# Patient Record
Sex: Female | Born: 1980 | Race: White | Hispanic: No | Marital: Married | Smoking: Never smoker
Health system: Southern US, Community
[De-identification: ages and names within clinical notes are randomized; demographics above are authoritative.]

## PROBLEM LIST (undated history)

## (undated) DIAGNOSIS — D649 Anemia, unspecified: Secondary | ICD-10-CM

## (undated) DIAGNOSIS — IMO0002 Reserved for concepts with insufficient information to code with codable children: Secondary | ICD-10-CM

## (undated) DIAGNOSIS — R9431 Abnormal electrocardiogram [ECG] [EKG]: Secondary | ICD-10-CM

## (undated) DIAGNOSIS — Z87898 Personal history of other specified conditions: Secondary | ICD-10-CM

## (undated) HISTORY — DX: Personal history of other specified conditions: Z87.898

## (undated) HISTORY — DX: Anemia, unspecified: D64.9

## (undated) HISTORY — DX: Abnormal electrocardiogram (ECG) (EKG): R94.31

## (undated) HISTORY — PX: OTHER SURGICAL HISTORY: SHX169

---

## 2011-10-05 DIAGNOSIS — F418 Other specified anxiety disorders: Secondary | ICD-10-CM | POA: Insufficient documentation

## 2013-11-16 DIAGNOSIS — Z87798 Personal history of other (corrected) congenital malformations: Secondary | ICD-10-CM | POA: Insufficient documentation

## 2014-01-06 ENCOUNTER — Ambulatory Visit: Payer: Managed Care, Other (non HMO) | Admitting: Family Medicine

## 2014-01-06 VITALS — BP 110/64 | HR 91 | Temp 98.1°F | Resp 16 | Ht 65.75 in | Wt 121.0 lb

## 2014-01-06 DIAGNOSIS — R110 Nausea: Secondary | ICD-10-CM

## 2014-01-06 DIAGNOSIS — R51 Headache: Secondary | ICD-10-CM

## 2014-01-06 DIAGNOSIS — R42 Dizziness and giddiness: Secondary | ICD-10-CM

## 2014-01-06 DIAGNOSIS — R9431 Abnormal electrocardiogram [ECG] [EKG]: Secondary | ICD-10-CM

## 2014-01-06 DIAGNOSIS — D649 Anemia, unspecified: Secondary | ICD-10-CM

## 2014-01-06 LAB — POCT URINALYSIS DIPSTICK
Bilirubin, UA: NEGATIVE
Blood, UA: NEGATIVE
Glucose, UA: NEGATIVE
Ketones, UA: NEGATIVE
Leukocytes, UA: NEGATIVE
NITRITE UA: NEGATIVE
Protein, UA: NEGATIVE
Spec Grav, UA: <=1.005
Urobilinogen, UA: 0.2
pH, UA: 6.5

## 2014-01-06 LAB — POCT CBC
Granulocyte percent: 66.5 % (ref 37–80)
HCT, POC: 43.3 % (ref 37.7–47.9)
Hemoglobin: 13.6 g/dL (ref 12.2–16.2)
Lymph, poc: 2.5 (ref 0.6–3.4)
MCH, POC: 30.6 pg (ref 27–31.2)
MCHC: 31.4 g/dL — AB (ref 31.8–35.4)
MCV: 97.5 fL — AB (ref 80–97)
MID (cbc): 0.6 (ref 0–0.9)
MPV: 10.2 fL (ref 0–99.8)
POC Granulocyte: 6.1 (ref 2–6.9)
POC LYMPH PERCENT: 27.4 % (ref 10–50)
POC MID %: 6.1 % (ref 0–12)
Platelet Count, POC: 222 K/uL (ref 142–424)
RBC: 4.44 M/uL (ref 4.04–5.48)
RDW, POC: 13.3 %
WBC: 9.1 K/uL (ref 4.6–10.2)

## 2014-01-06 LAB — POCT URINE PREGNANCY: Preg Test, Ur: NEGATIVE

## 2014-01-06 LAB — GLUCOSE, POCT (MANUAL RESULT ENTRY): POC Glucose: 82 mg/dl (ref 70–99)

## 2014-01-06 NOTE — Progress Notes (Signed)
Subjective:    Patient ID: Yolanda Williams, female    DOB: 1981-10-20, 33 y.o.   MRN: [id number]  HPI Yolanda Williams is a 33 y.o. female  Lightheaded spells with nausea, occasional HA since 9 days ago.  Every day but one. Usually late afternoon and evening. Worst one lasted few hours - helped with drinking OJ and eating some crackers. Other times about 15 minutes to an hour. No syncope. No room spinning - just feels unsteady.   Does not feel stressed, anxious or depressed, and no known stressors.   Similar sx's wen child and into teenage years - had workup then and unknown cause. Improved on own during college, only rare sx;s once or twice a year - just more frequent recently.   Hx of anemia since high school, last checked in November at physical at PCP [facility]). Hgb 12.3 then. Started on PNV past week. Missed ocp in December, but had normal pregnancy test recently, and LMP: Patient's last menstrual period was 12/19/2013.  Saw PCP 3 days ago for dizziness sx's - had negative UPT, no other evaluation.    There are no active problems to display for this patient.  Past Medical History  Diagnosis Date  . Anemia    History reviewed. No pertinent past surgical history. Not on File Prior to Admission medications   Medication Sig Start Date End Date Taking? Authorizing Provider  norgestimate-ethinyl estradiol (ORTHO-CYCLEN,SPRINTEC,PREVIFEM) 0.25-35 MG-MCG tablet Take 1 tablet by mouth daily.   Yes Historical Provider, MD   History   Social History  . Marital Status: Married    Spouse Name: N/A    Number of Children: N/A  . Years of Education: N/A   Occupational History  . Not on file.   Social History Main Topics  . Smoking status: Never Smoker   . Smokeless tobacco: Not on file  . Alcohol Use: Yes  . Drug Use: No  . Sexual Activity: Not on file   Other Topics Concern  . Not on file   Social History Narrative  . 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 Nonsmoker, [employer] -  occasional - 3 drinks per month. No IDU.     Review of Systems  Constitutional: Negative for fever, chills, appetite change and unexpected weight change.  Eyes: Negative for photophobia, pain and visual disturbance.       Wears CL's, no change in rx last summer.    Respiratory: Negative for chest tightness and shortness of breath.   Cardiovascular: Negative for chest pain, palpitations and leg swelling.  Gastrointestinal: Positive for nausea. Negative for vomiting, abdominal pain, diarrhea, constipation, blood in stool and anal bleeding.  Genitourinary: Positive for dysuria. Negative for frequency, hematuria, decreased urine volume, vaginal bleeding, vaginal discharge, difficulty urinating and menstrual problem.  Skin: Negative for rash.  Neurological: Positive for dizziness and headaches.  Psychiatric/Behavioral: Negative for dysphoric mood. The patient is not nervous/anxious.        Objective:   Physical Exam  Vitals reviewed. Constitutional: She is oriented to person, place, and time. She appears well-developed and well-nourished.  HENT:  Head: Normocephalic and atraumatic.  Eyes: Conjunctivae and EOM are normal. Pupils are equal, round, and reactive to light. Right eye exhibits no nystagmus. Left eye exhibits no nystagmus.  Neck: Trachea normal. Carotid bruit is not present. No thyromegaly present.  Cardiovascular: Normal rate, regular rhythm, normal heart sounds and intact distal pulses.   Pulmonary/Chest: Effort normal and breath sounds normal.  Abdominal: Soft. Bowel sounds  are normal. She exhibits no distension and no pulsatile midline mass. There is no tenderness.  Musculoskeletal: Normal range of motion.  Neurological: She is alert and oriented to person, place, and time. She has normal strength. She displays no atrophy and no tremor. No cranial nerve deficit or sensory deficit. She exhibits normal muscle tone. She displays a negative Romberg sign. She displays no seizure  activity. Coordination and gait normal.  Nonfocal, normal heel to toe, normal finger to nose.   Skin: Skin is warm and dry.  Psychiatric: She has a normal mood and affect. Her behavior is normal. Judgment and thought content normal.   Filed Vitals:   01/06/14 1418  BP: 110/64  Pulse: 91  Temp: 98.1 F (36.7 C)  TempSrc: Oral  Resp: 16  Height: 5' 5.75" (1.67 m)  Weight: 121 lb (54.885 kg)  SpO2: 100%   Results for orders placed in visit on 01/06/14  POCT CBC      Result Value Range   WBC 9.1  4.6 - 10.2 K/uL   Lymph, poc 2.5  0.6 - 3.4   POC LYMPH PERCENT 27.4  10 - 50 %L   MID (cbc) 0.6  0 - 0.9   POC MID % 6.1  0 - 12 %M   POC Granulocyte 6.1  2 - 6.9   Granulocyte percent 66.5  37 - 80 %G   RBC 4.44  4.04 - 5.48 M/uL   Hemoglobin 13.6  12.2 - 16.2 g/dL   HCT, POC 16.1  09.6 - 47.9 %   MCV 97.5 (*) 80 - 97 fL   MCH, POC 30.6  27 - 31.2 pg   MCHC 31.4 (*) 31.8 - 35.4 g/dL   RDW, POC 04.5     Platelet Count, POC 222  142 - 424 K/uL   MPV 10.2  0 - 99.8 fL  GLUCOSE, POCT (MANUAL RESULT ENTRY)      Result Value Range   POC Glucose 82  70 - 99 mg/dl  POCT URINALYSIS DIPSTICK      Result Value Range   Color, UA yellow     Clarity, UA clear     Glucose, UA neg     Bilirubin, UA neg     Ketones, UA neg     Spec Grav, UA <=1.005     Blood, UA neg     pH, UA 6.5     Protein, UA neg     Urobilinogen, UA 0.2     Nitrite, UA neg     Leukocytes, UA Negative    POCT URINE PREGNANCY      Result Value Range   Preg Test, Ur Negative     EKG: short PR interval, PR 106, RSR in V1.      Assessment & Plan:   Yolanda Williams is a 33 y.o. female Anemia -by hx , but normal hgb today.   Dizziness, Nausea alone, Headache. Check CMP, tsh, but with Shortened PR interval - Plan: Ambulatory referral to Cardiac Electrophysiology.  May be normal variant, but no prior EKG. Rtc/er precautions discussed.   Patient Instructions  You should receive a call or letter about your lab results  within the next week to 10 days.  Return to the clinic or go to the nearest emergency room if any of your symptoms worsen or new symptoms occur. We will refer you to a cardiologist to evaluate the short PR interval on your EKG.  Let me know if there are  other questions in the meantime.

## 2014-01-06 NOTE — Patient Instructions (Addendum)
You should receive a call or letter about your lab results within the next week to 10 days.  Return to the clinic or go to the nearest emergency room if any of your symptoms worsen or new symptoms occur. We will refer you to a cardiologist to evaluate the short PR interval on your EKG.  Let me know if there are other questions in the meantime.

## 2014-01-07 LAB — COMPREHENSIVE METABOLIC PANEL
ALBUMIN: 4.3 g/dL (ref 3.5–5.2)
ALK PHOS: 51 U/L (ref 39–117)
ALT: <8 U/L (ref 0–35)
AST: 15 U/L (ref 0–37)
BUN: 12 mg/dL (ref 6–23)
CHLORIDE: 100 meq/L (ref 96–112)
CO2: 30 mEq/L (ref 19–32)
Calcium: 9.5 mg/dL (ref 8.4–10.5)
Creat: 0.75 mg/dL (ref 0.50–1.10)
Glucose, Bld: 76 mg/dL (ref 70–99)
POTASSIUM: 4.3 meq/L (ref 3.5–5.3)
Sodium: 137 mEq/L (ref 135–145)
Total Bilirubin: 0.3 mg/dL (ref 0.3–1.2)
Total Protein: 7.1 g/dL (ref 6.0–8.3)

## 2014-01-07 LAB — TSH: TSH: 2.069 u[IU]/mL (ref 0.350–4.500)

## 2014-01-15 ENCOUNTER — Ambulatory Visit (INDEPENDENT_AMBULATORY_CARE_PROVIDER_SITE_OTHER): Payer: Managed Care, Other (non HMO) | Admitting: Internal Medicine

## 2014-01-15 ENCOUNTER — Encounter: Payer: Self-pay | Admitting: Internal Medicine

## 2014-01-15 VITALS — BP 124/80 | HR 80 | Ht 66.0 in | Wt 122.9 lb

## 2014-01-15 DIAGNOSIS — I456 Pre-excitation syndrome: Secondary | ICD-10-CM

## 2014-01-15 DIAGNOSIS — R42 Dizziness and giddiness: Secondary | ICD-10-CM

## 2014-01-15 NOTE — Patient Instructions (Signed)
Please call our office if you have any more episodes  Your physician recommends that you schedule a follow-up appointment as needed.  .Marland Kitchen

## 2014-01-15 NOTE — Progress Notes (Signed)
OFFICE NOTE  Chief Complaint:  Episodic lightheadedness, nausea  Primary Care Physician: Shade Flood, MD  HPI:  Yolanda Williams is a pleasant 33 year old female who is coming referred to me for evaluation of an abnormal EKG which demonstrated a short PR interval. 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 She reports that throughout most of her life she has had episodes of intermittent lightheadedness/nausea which can occur for various reasons throughout the day. Typically she has tried to go to sleep at night and wakes up in the morning feeling better. Over the past several weeks she's had these episodes more frequently and then had a sustained episode which lasted for about one week. The symptoms were present but waxed and waned throughout the days and she just did not feel right. She did not feel presyncopal, but was lightheaded and nauseated. She did not check her pulse rate during these episodes.  She was seen recently by Dr. Neva Seat who did an EKG and noted she did have a short PR interval. She denies palpitations or tachycardia. She continues on birth control and was recently tested for pregnancy and was negative.  PMHx:  Past Medical History  Diagnosis Date  . Anemia     History reviewed. No pertinent past surgical history.  FAMHx:  Family History  Problem Relation Age of Onset  . Hypertension Mother   . Valvular heart disease Brother     partly fused "cuspid" valves  . Diabetes      paternal side of family    SOCHx:   reports that she has never smoked. She has never used smokeless tobacco. She reports that she drinks alcohol. She reports that she does not use illicit drugs.  ALLERGIES:  No Known Allergies  ROS: A comprehensive review of systems was negative except for: Neurological: positive for dizziness and nausea  HOME MEDS: Current Outpatient Prescriptions  Medication Sig Dispense Refill  . ORTHO TRI-CYCLEN  LO 0.18/0.215/0.25 MG-25 MCG tab daily.      . Prenatal Vit-Fe Fumarate-FA (PRENATAL MULTIVITAMIN) TABS tablet Take 1 tablet by mouth daily at 12 noon.      . propranolol (INDERAL) 10 MG tablet as needed.       No current facility-administered medications for this visit.    LABS/IMAGING: No results found for this or any previous visit (from the past 48 hour(s)). No results found.  VITALS: BP 124/80  Pulse 80  Ht 5\' 6"  (1.676 m)  Wt 122 lb 14.4 oz (55.747 kg)  BMI 19.85 kg/m2  LMP 12/19/2013  EXAM: General appearance: alert and no distress, thin, well-dressed Neck: no carotid bruit and no JVD Lungs: clear to auscultation bilaterally Heart: regular rate and rhythm, S1, S2 normal, no murmur, click, rub or gallop Abdomen: soft, non-tender; bowel sounds normal; no masses,  no organomegaly Extremities: extremities normal, atraumatic, no cyanosis or edema Pulses: 2+ and symmetric Skin: Skin color, texture, turgor normal. No rashes or lesions Neurologic: Grossly normal Psych: Normal mood, affect  EKG: Ectopic atrial rhythm at 80, PR interval less than 60 milliseconds, normal QRS complex (possible LGL variant)  ASSESSMENT: 1. Lightheadedness/nausea/fatigue 2. 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   Mrs. Christell Constant has an abnormal EKG with short PR interval, which raises the possibility of LGL syndrome. This could of course put her risk for tachyarrhythmias which could cause her to be lightheaded, nauseated and fatigued if her heart rate was elevated. Typically  patients are aware of this, but she denies any palpitations, however he never checked her pulse rate when she felt bad. I think would be worthwhile for her to check for this if she has further episodes. He does they're fairly infrequent, monitoring will not likely be high-yield. Her exam is fairly benign, and I do not believe echocardiography would be helpful.  She did report having echocardi[place] in [place] when she  was in high school, she does have a brother who has some valvular heart disease.  Her exam was apparently normal. I did counsel her in the use of vagal maneuvers, should she note that her heart rate is elevated, to try to terminate any possible arrhythmias.  In addition, she takes Inderal as needed for performance anxiety.  She was advised that she could take a shorter acting medication as needed if she feels dizzy or nauseated and her heart rate is elevated.  She should contact me as needed for followup. Thank you again for the kind referral.  Chrystie NoseKenneth C. Phillips Goulette, MD, [facility] Attending Cardiologist [facility]  Cleophas Yoak C 01/15/2014, 4:27 PM

## 2014-01-16 ENCOUNTER — Encounter: Payer: Self-pay | Admitting: Internal Medicine

## 2014-03-09 ENCOUNTER — Telehealth: Payer: Self-pay | Admitting: Internal Medicine

## 2014-03-09 NOTE — Telephone Encounter (Signed)
Dr. Rennis GoldenHilty notified and advised pt can wear 30-day event monitor if she likes, but still may not get any info since symptoms are not regular.  Returned call and pt verified x 2.  Informed message received.  Stated she did faint on Monday night, not Tuesday.  Stated she was seen in the Urgent Care the next day b[place]c [place] feeling [place] and they suggested she f/u with our office.   Pt informed per Dr. Rennis GoldenHilty and verbalized understanding.  Stated she will wait on the monitor b/c her symptoms are rare.  Pt advised to try to document any symptoms she may have prior to the episodes, which may help her identify when it is about to happen.  Pt also informed she can call back if she changes her mind about the monitor since Dr. Rennis GoldenHilty has given a VO for it.  Pt verbalized understanding and agreed w/ plan.

## 2014-03-09 NOTE — Telephone Encounter (Signed)
Pt wanted you to know that she fainted on Tuesday night,she was completely out. Pt was out of town when this happen. She wants to know if he wants her to wear monitor?

## 2014-05-24 ENCOUNTER — Ambulatory Visit (INDEPENDENT_AMBULATORY_CARE_PROVIDER_SITE_OTHER): Payer: Managed Care, Other (non HMO) | Admitting: Emergency Medicine

## 2014-05-24 VITALS — BP 108/68 | HR 67 | Temp 98.0°F | Resp 16 | Ht 66.0 in | Wt 121.0 lb

## 2014-05-24 DIAGNOSIS — K120 Recurrent oral aphthae: Secondary | ICD-10-CM

## 2014-05-24 NOTE — Patient Instructions (Signed)
Canker Sores  Canker sores are painful, open sores on the inside of the mouth and cheek. They may be white or yellow. The sores usually heal in 1 to 2 weeks. Women are more likely than men to have recurrent canker sores. CAUSES The cause of canker sores is not well understood. More than one cause is likely. Canker sores do not appear to be caused by certain types of germs (viruses or bacteria). Canker sores may be caused by:  An allergic reaction to certain foods.  Digestive problems.  Not having enough vitamin B12, folic acid, and iron.  Female sex hormones. Sores may come only during certain phases of a menstrual cycle. Often, there is improvement during pregnancy.  Genetics. Some people seem to inherit canker sore problems. Emotional stress and injuries to the mouth may trigger outbreaks, but not cause them.  DIAGNOSIS Canker sores are diagnosed by exam.  TREATMENT  Patients who have frequent bouts of canker sores may have cultures taken of the sores, blood tests, or allergy tests. This helps determine if their sores are caused by a poor diet, an allergy, or some other preventable or treatable disease.  Vitamins may prevent recurrences or reduce the severity of canker sores in people with poor nutrition.  Numbing ointments can relieve pain. These are available in drug stores without a prescription.  Anti-inflammatory steroid mouth rinses or gels may be prescribed by your caregiver for severe sores.  Oral steroids may be prescribed if you have severe, recurrent canker sores. These strong medicines can cause many side effects and should be used only under the close direction of a dentist or physician.  Mouth rinses containing the antibiotic medicine may be prescribed. They may lessen symptoms and speed healing. Healing usually happens in about 1 or 2 weeks with or without treatment. Certain antibiotic mouth rinses given to pregnant women and young children can permanently stain teeth.  Talk to your caregiver about your treatment. HOME CARE INSTRUCTIONS   Avoid foods that cause canker sores for you.  Avoid citrus juices, spicy or salty foods, and coffee until the sores are healed.  Use a soft-bristled toothbrush.  Chew your food carefully to avoid biting your cheek.  Apply topical numbing medicine to the sore to help relieve pain.  Apply a thin paste of baking soda and water to the sore to help heal the sore.  Only use mouth rinses or medicines for pain or discomfort as directed by your caregiver. SEEK MEDICAL CARE IF:   Your symptoms are not better in 1 week.  Your sores are still present after 2 weeks.  Your sores are very painful.  You have trouble breathing or swallowing.  Your sores come back frequently. Document Released: 04/10/2011 Document Revised: 04/10/2013 Document Reviewed: 04/10/2011 Exit[employer] Patient Information 2014 [employer].  

## 2014-05-24 NOTE — Progress Notes (Signed)
[facility] [address] [place] [address] [phone]  Date:  05/24/2014   Name:  Yolanda Williams   DOB:  August 24, 1981   MRN:  [id number]  PCP:  Shade Flood, MD    Chief Complaint: bump on neck and in mouth   History of Present Illness:  Yolanda Williams is a 33 y.o. very pleasant female patient who presents with the following:  Noticed a sore on the lingual gingiva on the left jaw.  Painful and tender.  No history of injury.  No antecedent illness. Thinks she may have a swollen lymph node on her thyroid cartilage.  No fever or chills. No rash.  No improvement with over the counter medications or other home remedies. Denies other complaint or health concern today.   Patient Active Problem List   Diagnosis Date Noted  . Lightheadedness 01/15/2014  . 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 Anemia     No past surgical history on file.  History  Substance Use Topics  . Smoking status: Never Smoker   . Smokeless tobacco: Never Used  . Alcohol Use: Yes     Comment: social     Family History  Problem Relation Age of Onset  . Hypertension Mother   . Valvular heart disease Brother     partly fused "cuspid" valves  . Diabetes      paternal side of family    No Known Allergies  Medication list has been reviewed and updated.  Current Outpatient Prescriptions on File Prior to Visit  Medication Sig Dispense Refill  . Prenatal Vit-Fe Fumarate-FA (PRENATAL MULTIVITAMIN) TABS tablet Take 1 tablet by mouth daily at 12 noon.      . propranolol (INDERAL) 10 MG tablet as needed.       No current facility-administered medications on file prior to visit.    Review of Systems:  As per HPI, otherwise negative.    Physical Examination: Filed Vitals:   05/24/14 1516  BP: 108/68  Pulse: 67  Temp: 98 F (36.7 C)  Resp: 16   Filed Vitals:   05/24/14 1516  Height: 5\' 6"  (1.676 m)  Weight: 121 lb  (54.885 kg)   Body mass index is 19.54 kg/(m^2). Ideal Body Weight: Weight in (lb) to have BMI = 25: 154.6   GEN: WDWN, NAD, Non-toxic, Alert & Oriented x 3 HEENT: Atraumatic, Normocephalic.   Aphthous ulcer.  No lymphadenopathy Ears and Nose: No external deformity. EXTR: No clubbing/cyanosis/edema NEURO: Normal gait.  PSYCH: Normally interactive. Conversant. Not depressed or anxious appearing.  Calm demeanor.    Assessment and Plan: Aphthous ulcer  Signed,  Phillips Odor, MD

## 2014-05-25 ENCOUNTER — Encounter: Payer: Managed Care, Other (non HMO) | Admitting: Family Medicine

## 2014-07-15 NOTE — Progress Notes (Signed)
This encounter was created in error - please disregard.

## 2014-12-28 NOTE — L&D Delivery Note (Signed)
Delivery Note At 7:46 PM a viable and healthy female was delivered via Vaginal, Spontaneous Delivery (Presentation: Right Occiput Anterior).  APGAR: 9, 9; weight P .   Placenta status: Intact, Spontaneous.  Cord: 2 vessels with the following complications: None.    Anesthesia:  epidural Episiotomy:  none Lacerations: 1st degree;Perineal;Labial Suture Repair: 3.0 vicryl rapide Est. Blood Loss (mL):  96cc  Mom to postpartum.  Baby to Couplet care / Skin to Skin.  Bovard-Stuckert, Mj Willis 09/10/2015, 8:07 PM  Br/RI/Tdap in PNC/Contra?

## 2015-01-12 ENCOUNTER — Encounter: Payer: Self-pay | Admitting: Family Medicine

## 2015-01-12 ENCOUNTER — Ambulatory Visit (INDEPENDENT_AMBULATORY_CARE_PROVIDER_SITE_OTHER): Payer: Managed Care, Other (non HMO) | Admitting: Family Medicine

## 2015-01-12 VITALS — BP 122/68 | HR 87 | Temp 98.4°F | Resp 17 | Ht 66.5 in | Wt 125.0 lb

## 2015-01-12 DIAGNOSIS — H00016 Hordeolum externum left eye, unspecified eyelid: Secondary | ICD-10-CM

## 2015-01-12 DIAGNOSIS — H0289 Other specified disorders of eyelid: Secondary | ICD-10-CM

## 2015-01-12 DIAGNOSIS — H5712 Ocular pain, left eye: Secondary | ICD-10-CM

## 2015-01-12 MED ORDER — ERYTHROMYCIN 5 MG/GM OP OINT: 1.0000 "application " | TOPICAL_OINTMENT | Freq: Three times a day (TID) | OPHTHALMIC | Status: DC

## 2015-01-12 NOTE — Progress Notes (Signed)
Subjective:    Patient ID: Yolanda Williams, female    DOB: Sep 23, 1981, 33 y.o.   MRN: [id number] This chart was scribed for Meredith Staggers, MD by Littie Deeds, Medical Scribe. This patient was seen in Room 14 and the patient's care was started at 2:06 PM.   HPI HPI Comments: Yolanda Williams is a 34 y.o. female who presents to the [facility] complaining of gradual onset left eye pain with gradually increasing redness and swelling that started 3 days ago. Patient states she changed her 28-month contacts the day before onset of symptoms after wearing them for about 1 month. She changed out her contacts again the day after symptoms started. Patient has been trying warm compresses the past 2 nights. She denies visual disturbances, eye discharge, fever and chills. Patient does not sleep with her contacts in. She states she has gotten styes in the past, but they typically resolve within a day.  Patient has been traveling for work and states it has been busy. She is possibly pregnant, but is not sure; she has been trying to get pregnant for several months.  Patient Active Problem List   Diagnosis Date Noted  . Lightheadedness 01/15/2014  . 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 Anemia    No past surgical history on file. No Known Allergies Prior to Admission medications   Medication Sig Start Date End Date Taking? Authorizing Provider  Multiple Vitamins-Minerals (MULTIVITAMIN WITH MINERALS) tablet Take 1 tablet by mouth daily.   Yes Historical Provider, MD   History   Social History  . Marital Status: Married    Spouse Name: N/A    Number of Children: N/A  . Years of Education: N/A   Occupational History  . Not on file.   Social History Main Topics  . Smoking status: Never Smoker   . Smokeless tobacco: Never Used  . Alcohol Use: Yes     Comment: social   . Drug Use: No  . Sexual Activity: Not on file   Other Topics  Concern  . Not on file   Social History Narrative     Review of Systems  Constitutional: Negative for fever and chills.  Eyes: Positive for pain and redness. Negative for discharge and visual disturbance.       Objective:   Physical Exam  Constitutional: She is oriented to person, place, and time. She appears well-developed and well-nourished. No distress.  HENT:  Head: Normocephalic and atraumatic.  Mouth/Throat: Oropharynx is clear and moist. No oropharyngeal exudate.  Eyes: EOM are normal. Pupils are equal, round, and reactive to light.  Slight erythema and edema on middle aspect of left eyelid. One small possible early stye on medial to mid aspect just inside the lid margin. No significant crusting seen at canthi or in eyelid.  Neck: Neck supple.  Cardiovascular: Normal rate.   Pulmonary/Chest: Effort normal.  Musculoskeletal: She exhibits no edema.  Neurological: She is alert and oriented to person, place, and time. No cranial nerve deficit.  Skin: Skin is warm and dry. No rash noted.  Psychiatric: She has a normal mood and affect. Her behavior is normal.  Nursing note and vitals reviewed.     Filed Vitals:   01/12/15 1334  BP: 122/68  Pulse: 87  Temp: 98.4 F (36.9 C)  TempSrc: Oral  Resp: 17  Height: 5' 6.5" (1.689 m)  Weight: 125 lb (56.7 kg)  SpO2: 98%  Visual Acuity Screening   Right eye Left eye Both eyes  Without correction:     With correction: 20/13 20/13 20/13        Assessment & Plan:   Yolanda Williams is a 34 y.o. female Eyelid pain, left - Plan: erythromycin (ROMYCIN) ophthalmic ointment  Sty, left - Plan: erythromycin (ROMYCIN) ophthalmic ointment Possible early sty or blepharitis, but no crusting.   -start emycin ophthalmic ointment, warm compresses, sx care and rtc precautions.   Meds ordered this encounter  Medications  . Multiple Vitamins-Minerals (MULTIVITAMIN WITH MINERALS) tablet    Sig: Take 1 tablet by mouth daily.  Marland Kitchen.  erythromycin [facility](ROMYCIN) ophthalmic ointment    Sig: Place 1 application into the left eye 3 (three) times daily. 1/2 inch ribbon under lid.  For 7 days.    Dispense:  3.5 g    Refill:  0   Patient Instructions  Start warm compresses, ointment as discussed for possible early sty. Return to the clinic or go to the nearest emergency room if any of your symptoms worsen or new symptoms occur.  Sty A sty (hordeolum) is an infection of a gland in the eyelid located at the base of the eyelash. A sty may develop a white or yellow head of pus. It can be puffy (swollen). Usually, the sty will burst and pus will come out on its own. They do not leave lumps in the eyelid once they drain. A sty is often confused with another form of cyst of the eyelid called a chalazion. Chalazions occur within the eyelid and not on the edge where the bases of the eyelashes are. They often are red, sore and then form firm lumps in the eyelid. CAUSES   Germs (bacteria).  Lasting (chronic) eyelid inflammation. SYMPTOMS   Tenderness, redness and swelling along the edge of the eyelid at the base of the eyelashes.  Sometimes, there is a white or yellow head of pus. It may or may not drain. DIAGNOSIS  An ophthalmologist will be able to distinguish between a sty and a chalazion and treat the condition appropriately.  TREATMENT   Styes are typically treated with warm packs (compresses) until drainage occurs.  In rare cases, medicines that kill germs (antibiotics) may be prescribed. These antibiotics may be in the form of drops, cream or pills.  If a hard lump has formed, it is generally necessary to do a small incision and remove the hardened contents of the cyst in a minor surgical procedure done in the office.  In suspicious cases, your caregiver may send the contents of the cyst to the lab to be certain that it is not a rare, but dangerous form of cancer of the glands of the eyelid. HOME CARE INSTRUCTIONS   Wash your  hands often and dry them with a clean towel. Avoid touching your eyelid. This may spread the infection to other parts of the eye.  Apply heat to your eyelid for 10 to 20 minutes, several times a day, to ease pain and help to heal it faster.  Do not squeeze the sty. Allow it to drain on its own. Wash your eyelid carefully 3 to 4 times per day to remove any pus. SEEK IMMEDIATE MEDICAL CARE IF:   Your eye becomes painful or puffy (swollen).  Your vision changes.  Your sty does not drain by itself within 3 days.  Your sty comes back within a short period of time, even with treatment.  You have redness (inflammation) around the  eye.  You have a fever. Document Released: 09/23/2005 Document Revised: 03/07/2012 Document Reviewed: 03/30/2014 [facility] Patient Information 2015 [place], [place]. This information is not intended to replace advice given to you by your health care provider. Make sure you discuss any questions you have with your health care provider.     I personally performed the services described in this documentation, which was scribed in my presence. The recorded information has been reviewed and considered, and addended by me as needed.

## 2015-01-12 NOTE — Patient Instructions (Signed)
Start warm compresses, ointment as discussed for possible early sty. Return to the clinic or go to the nearest emergency room if any of your symptoms worsen or new symptoms occur.  Sty A sty (hordeolum) is an infection of a gland in the eyelid located at the base of the eyelash. A sty may develop a white or yellow head of pus. It can be puffy (swollen). Usually, the sty will burst and pus will come out on its own. They do not leave lumps in the eyelid once they drain. A sty is often confused with another form of cyst of the eyelid called a chalazion. Chalazions occur within the eyelid and not on the edge where the bases of the eyelashes are. They often are red, sore and then form firm lumps in the eyelid. CAUSES   Germs (bacteria).  Lasting (chronic) eyelid inflammation. SYMPTOMS   Tenderness, redness and swelling along the edge of the eyelid at the base of the eyelashes.  Sometimes, there is a white or yellow head of pus. It may or may not drain. DIAGNOSIS  An ophthalmologist will be able to distinguish between a sty and a chalazion and treat the condition appropriately.  TREATMENT   Styes are typically treated with warm packs (compresses) until drainage occurs.  In rare cases, medicines that kill germs (antibiotics) may be prescribed. These antibiotics may be in the form of drops, cream or pills.  If a hard lump has formed, it is generally necessary to do a small incision and remove the hardened contents of the cyst in a minor surgical procedure done in the office.  In suspicious cases, your caregiver may send the contents of the cyst to the lab to be certain that it is not a rare, but dangerous form of cancer of the glands of the eyelid. HOME CARE INSTRUCTIONS   Wash your hands often and dry them with a clean towel. Avoid touching your eyelid. This may spread the infection to other parts of the eye.  Apply heat to your eyelid for 10 to 20 minutes, several times a day, to ease pain  and help to heal it faster.  Do not squeeze the sty. Allow it to drain on its own. Wash your eyelid carefully 3 to 4 times per day to remove any pus. SEEK IMMEDIATE MEDICAL CARE IF:   Your eye becomes painful or puffy (swollen).  Your vision changes.  Your sty does not drain by itself within 3 days.  Your sty comes back within a short period of time, even with treatment.  You have redness (inflammation) around the eye.  You have a fever. Document Released: 09/23/2005 Document Revised: 03/07/2012 Document Reviewed: 03/30/2014 [facility] Patient Information 2015 [place], [place]. This information is not intended to replace advice given to you by your health care provider. Make sure you discuss any questions you have with your health care provider.

## 2015-02-19 LAB — OB RESULTS CONSOLE ABO/RH: RH Type: POSITIVE

## 2015-02-19 LAB — OB RESULTS CONSOLE RPR
RPR: NONREACTIVE
RPR: NONREACTIVE

## 2015-02-19 LAB — OB RESULTS CONSOLE GC/CHLAMYDIA
Chlamydia: NEGATIVE
GC PROBE AMP, GENITAL: NEGATIVE

## 2015-02-19 LAB — OB RESULTS CONSOLE HEPATITIS B SURFACE ANTIGEN: Hepatitis B Surface Ag: NEGATIVE

## 2015-02-19 LAB — OB RESULTS CONSOLE RUBELLA ANTIBODY, IGM: Rubella: IMMUNE

## 2015-02-19 LAB — OB RESULTS CONSOLE ANTIBODY SCREEN: ANTIBODY SCREEN: NEGATIVE

## 2015-02-19 LAB — OB RESULTS CONSOLE HIV ANTIBODY (ROUTINE TESTING): HIV: NONREACTIVE

## 2015-08-27 LAB — OB RESULTS CONSOLE GBS
STREP GROUP B AG: NEGATIVE
STREP GROUP B AG: NEGATIVE
STREP GROUP B AG: NEGATIVE

## 2015-09-07 ENCOUNTER — Ambulatory Visit (HOSPITAL_COMMUNITY)
Admission: RE | Admit: 2015-09-07 | Payer: Managed Care, Other (non HMO) | Source: Ambulatory Visit | Admitting: Obstetrics and Gynecology

## 2015-09-07 ENCOUNTER — Other Ambulatory Visit (HOSPITAL_COMMUNITY): Payer: Self-pay | Admitting: Obstetrics and Gynecology

## 2015-09-07 ENCOUNTER — Inpatient Hospital Stay (HOSPITAL_COMMUNITY)
Admission: AD | Admit: 2015-09-07 | Payer: Managed Care, Other (non HMO) | Source: Ambulatory Visit | Admitting: Obstetrics and Gynecology

## 2015-09-07 ENCOUNTER — Ambulatory Visit (HOSPITAL_COMMUNITY)
Admission: RE | Admit: 2015-09-07 | Discharge: 2015-09-07 | Disposition: A | Discharge status: Home / Self Care | Payer: Managed Care, Other (non HMO) | Source: Ambulatory Visit | Attending: Obstetrics and Gynecology | Admitting: Obstetrics and Gynecology

## 2015-09-07 ENCOUNTER — Ambulatory Visit (HOSPITAL_COMMUNITY)
Admission: AD | Admit: 2015-09-07 | Payer: Managed Care, Other (non HMO) | Source: Ambulatory Visit | Admitting: Obstetrics and Gynecology

## 2015-09-07 ENCOUNTER — Ambulatory Visit (HOSPITAL_COMMUNITY): Payer: Managed Care, Other (non HMO)

## 2015-09-07 DIAGNOSIS — O283 Abnormal ultrasonic finding on antenatal screening of mother: Secondary | ICD-10-CM | POA: Diagnosis not present

## 2015-09-07 DIAGNOSIS — O288 Other abnormal findings on antenatal screening of mother: Secondary | ICD-10-CM

## 2015-09-07 DIAGNOSIS — Z3A37 37 weeks gestation of pregnancy: Secondary | ICD-10-CM | POA: Diagnosis not present

## 2015-09-07 DIAGNOSIS — O4103X0 Oligohydramnios, third trimester, not applicable or unspecified: Secondary | ICD-10-CM | POA: Insufficient documentation

## 2015-09-08 ENCOUNTER — Ambulatory Visit (HOSPITAL_COMMUNITY): Payer: Managed Care, Other (non HMO)

## 2015-09-10 ENCOUNTER — Encounter (HOSPITAL_COMMUNITY): Payer: Self-pay | Admitting: General Surgery

## 2015-09-10 ENCOUNTER — Inpatient Hospital Stay (HOSPITAL_COMMUNITY): Payer: Managed Care, Other (non HMO) | Admitting: Anesthesiology

## 2015-09-10 ENCOUNTER — Inpatient Hospital Stay (HOSPITAL_COMMUNITY)
Admission: AD | Admit: 2015-09-10 | Discharge: 2015-09-12 | DRG: 775 | Disposition: A | Discharge status: Home / Self Care | Payer: Managed Care, Other (non HMO) | Source: Ambulatory Visit | Attending: Obstetrics and Gynecology | Admitting: Obstetrics and Gynecology

## 2015-09-10 DIAGNOSIS — Z8249 Family history of ischemic heart disease and other diseases of the circulatory system: Secondary | ICD-10-CM

## 2015-09-10 DIAGNOSIS — Z3A37 37 weeks gestation of pregnancy: Secondary | ICD-10-CM | POA: Diagnosis not present

## 2015-09-10 DIAGNOSIS — O365930 Maternal care for other known or suspected poor fetal growth, third trimester, not applicable or unspecified: Secondary | ICD-10-CM | POA: Diagnosis present

## 2015-09-10 DIAGNOSIS — O09513 Supervision of elderly primigravida, third trimester: Secondary | ICD-10-CM | POA: Diagnosis present

## 2015-09-10 DIAGNOSIS — Q270 Congenital absence and hypoplasia of umbilical artery: Secondary | ICD-10-CM

## 2015-09-10 DIAGNOSIS — O9902 Anemia complicating childbirth: Secondary | ICD-10-CM | POA: Diagnosis present

## 2015-09-10 DIAGNOSIS — Z3490 Encounter for supervision of normal pregnancy, unspecified, unspecified trimester: Secondary | ICD-10-CM

## 2015-09-10 DIAGNOSIS — Z833 Family history of diabetes mellitus: Secondary | ICD-10-CM

## 2015-09-10 DIAGNOSIS — IMO0002 Reserved for concepts with insufficient information to code with codable children: Secondary | ICD-10-CM

## 2015-09-10 HISTORY — DX: Reserved for concepts with insufficient information to code with codable children: IMO0002

## 2015-09-10 LAB — CBC
HEMATOCRIT: 37.8 % (ref 36.0–46.0)
HEMOGLOBIN: 12.6 g/dL (ref 12.0–15.0)
MCH: 31.3 pg (ref 26.0–34.0)
MCHC: 33.3 g/dL (ref 30.0–36.0)
MCV: 93.8 fL (ref 78.0–100.0)
Platelets: 184 10*3/uL (ref 150–400)
RBC: 4.03 MIL/uL (ref 3.87–5.11)
RDW: 13.4 % (ref 11.5–15.5)
WBC: 9.2 10*3/uL (ref 4.0–10.5)

## 2015-09-10 LAB — TYPE AND SCREEN
ABO/RH(D): B POS
ANTIBODY SCREEN: NEGATIVE

## 2015-09-10 LAB — ABO/RH: ABO/RH(D): B POS

## 2015-09-10 MED ORDER — FLEET ENEMA 7-19 GM/118ML RE ENEM: 1.0000 | ENEMA | Freq: Once | RECTAL | Status: DC

## 2015-09-10 MED ORDER — IBUPROFEN 600 MG PO TABS
600.0000 mg | ORAL_TABLET | Freq: Four times a day (QID) | ORAL | Status: DC
  Administered 2015-09-10 – 2015-09-12 (×7): 600 mg via ORAL
  Filled 2015-09-10 (×7): qty 1

## 2015-09-10 MED ORDER — LACTATED RINGERS IV SOLN
500.0000 mL | INTRAVENOUS | Status: DC | PRN
  Administered 2015-09-10 (×2): 500 mL via INTRAVENOUS

## 2015-09-10 MED ORDER — OXYCODONE-ACETAMINOPHEN 5-325 MG PO TABS: 1.0000 | ORAL_TABLET | ORAL | Status: DC | PRN

## 2015-09-10 MED ORDER — LIDOCAINE HCL (PF) 1 % IJ SOLN
INTRAMUSCULAR | Status: DC | PRN
  Administered 2015-09-10 (×2): 4 mL via EPIDURAL
  Administered 2015-09-10: 2 mL via EPIDURAL

## 2015-09-10 MED ORDER — SENNOSIDES-DOCUSATE SODIUM 8.6-50 MG PO TABS
2.0000 | ORAL_TABLET | ORAL | Status: DC
  Administered 2015-09-10 – 2015-09-12 (×2): 2 via ORAL
  Filled 2015-09-10 (×2): qty 2

## 2015-09-10 MED ORDER — OXYTOCIN BOLUS FROM INFUSION
500.0000 mL | INTRAVENOUS | Status: DC
  Administered 2015-09-10: 500 mL via INTRAVENOUS

## 2015-09-10 MED ORDER — EPHEDRINE 5 MG/ML INJ
5.0000 mg | Freq: Once | INTRAVENOUS | Status: AC
  Administered 2015-09-10: 5 mg via INTRAVENOUS

## 2015-09-10 MED ORDER — OXYCODONE-ACETAMINOPHEN 5-325 MG PO TABS: 2.0000 | ORAL_TABLET | ORAL | Status: DC | PRN

## 2015-09-10 MED ORDER — ZOLPIDEM TARTRATE 5 MG PO TABS: 5.0000 mg | ORAL_TABLET | Freq: Every evening | ORAL | Status: DC | PRN

## 2015-09-10 MED ORDER — ACETAMINOPHEN 325 MG PO TABS: 650.0000 mg | ORAL_TABLET | ORAL | Status: DC | PRN

## 2015-09-10 MED ORDER — LACTATED RINGERS IV SOLN
INTRAVENOUS | Status: DC
  Administered 2015-09-10: 250 mL via INTRAVENOUS
  Administered 2015-09-10: 125 mL/h via INTRAVENOUS

## 2015-09-10 MED ORDER — LACTATED RINGERS IV SOLN: INTRAVENOUS | Status: DC

## 2015-09-10 MED ORDER — BENZOCAINE-MENTHOL 20-0.5 % EX AERO
1.0000 "application " | INHALATION_SPRAY | CUTANEOUS | Status: DC | PRN
  Administered 2015-09-10: 1 via TOPICAL
  Filled 2015-09-10: qty 56

## 2015-09-10 MED ORDER — DIPHENHYDRAMINE HCL 25 MG PO CAPS: 25.0000 mg | ORAL_CAPSULE | Freq: Four times a day (QID) | ORAL | Status: DC | PRN

## 2015-09-10 MED ORDER — SIMETHICONE 80 MG PO CHEW: 80.0000 mg | CHEWABLE_TABLET | ORAL | Status: DC | PRN

## 2015-09-10 MED ORDER — PRENATAL MULTIVITAMIN CH
1.0000 | ORAL_TABLET | Freq: Every day | ORAL | Status: DC
  Administered 2015-09-11: 1 via ORAL
  Filled 2015-09-10 (×2): qty 1

## 2015-09-10 MED ORDER — TERBUTALINE SULFATE 1 MG/ML IJ SOLN
0.2500 mg | Freq: Once | INTRAMUSCULAR | Status: DC | PRN
  Filled 2015-09-10: qty 1

## 2015-09-10 MED ORDER — EPHEDRINE 5 MG/ML INJ
10.0000 mg | INTRAVENOUS | Status: DC | PRN
  Filled 2015-09-10: qty 4
  Filled 2015-09-10: qty 2

## 2015-09-10 MED ORDER — DIBUCAINE 1 % RE OINT: 1.0000 "application " | TOPICAL_OINTMENT | RECTAL | Status: DC | PRN

## 2015-09-10 MED ORDER — LANOLIN HYDROUS EX OINT: TOPICAL_OINTMENT | CUTANEOUS | Status: DC | PRN

## 2015-09-10 MED ORDER — CITRIC ACID-SODIUM CITRATE 334-500 MG/5ML PO SOLN: 30.0000 mL | ORAL | Status: DC | PRN

## 2015-09-10 MED ORDER — LIDOCAINE HCL (PF) 1 % IJ SOLN
30.0000 mL | INTRAMUSCULAR | Status: DC | PRN
  Filled 2015-09-10: qty 30

## 2015-09-10 MED ORDER — ONDANSETRON HCL 4 MG/2ML IJ SOLN: 4.0000 mg | Freq: Four times a day (QID) | INTRAMUSCULAR | Status: DC | PRN

## 2015-09-10 MED ORDER — OXYTOCIN 40 UNITS IN LACTATED RINGERS INFUSION - SIMPLE MED
62.5000 mL/h | INTRAVENOUS | Status: DC
  Administered 2015-09-10: 62.5 mL/h via INTRAVENOUS

## 2015-09-10 MED ORDER — WITCH HAZEL-GLYCERIN EX PADS: 1.0000 "application " | MEDICATED_PAD | CUTANEOUS | Status: DC | PRN

## 2015-09-10 MED ORDER — OXYTOCIN 40 UNITS IN LACTATED RINGERS INFUSION - SIMPLE MED
1.0000 m[IU]/min | INTRAVENOUS | Status: DC
  Administered 2015-09-10: 2 m[IU]/min via INTRAVENOUS
  Administered 2015-09-10: 12 m[IU]/min via INTRAVENOUS
  Administered 2015-09-10: 10 m[IU]/min via INTRAVENOUS
  Administered 2015-09-10: 8 m[IU]/min via INTRAVENOUS
  Administered 2015-09-10: 6 m[IU]/min via INTRAVENOUS
  Filled 2015-09-10 (×2): qty 1000

## 2015-09-10 MED ORDER — BUTORPHANOL TARTRATE 1 MG/ML IJ SOLN: 1.0000 mg | INTRAMUSCULAR | Status: DC | PRN

## 2015-09-10 MED ORDER — PHENYLEPHRINE 40 MCG/ML (10ML) SYRINGE FOR IV PUSH (FOR BLOOD PRESSURE SUPPORT)
80.0000 ug | PREFILLED_SYRINGE | INTRAVENOUS | Status: AC | PRN
  Administered 2015-09-10: 120 ug via INTRAVENOUS
  Administered 2015-09-10 (×2): 80 ug via INTRAVENOUS
  Filled 2015-09-10: qty 20

## 2015-09-10 MED ORDER — ONDANSETRON HCL 4 MG PO TABS: 4.0000 mg | ORAL_TABLET | ORAL | Status: DC | PRN

## 2015-09-10 MED ORDER — FENTANYL 2.5 MCG/ML BUPIVACAINE 1/10 % EPIDURAL INFUSION (WH - ANES)
14.0000 mL/h | INTRAMUSCULAR | Status: DC | PRN
  Administered 2015-09-10 (×2): 14 mL/h via EPIDURAL
  Filled 2015-09-10: qty 125

## 2015-09-10 MED ORDER — DIPHENHYDRAMINE HCL 50 MG/ML IJ SOLN: 12.5000 mg | INTRAMUSCULAR | Status: DC | PRN

## 2015-09-10 MED ORDER — CALCIUM CARBONATE ANTACID 500 MG PO CHEW: 1.0000 | CHEWABLE_TABLET | Freq: Every day | ORAL | Status: DC | PRN

## 2015-09-10 MED ORDER — ONDANSETRON HCL 4 MG/2ML IJ SOLN: 4.0000 mg | INTRAMUSCULAR | Status: DC | PRN

## 2015-09-10 NOTE — H&P (Signed)
Yolanda Williams is a 34 y.o. female G1P0 for IOL, IUGR (13% - AC 3%) and SUA.   Elderly Primigravida.  Otherwise uncomplicated PNC.  +FM, no LOF, no VB, occ ctx.  D/W pt IOL inc r/b/a and process.    Maternal Medical History:  Contractions: Frequency: irregular.    Fetal activity: Perceived fetal activity is normal.    Prenatal complications: IUGR.   Prenatal Complications - Diabetes: none.    OB History    Gravida Para Term Preterm AB TAB SAB Ectopic Multiple Living   1             G1 present H/o EGW No abn pap  Past Medical History  Diagnosis Date  . Anemia   . 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 IUGR (intrauterine growth restriction) 09/10/2015   History reviewed. No pertinent past surgical history. Family History: family history includes Diabetes in an other family member; Hypertension in her mother; Valvular heart disease in her brother. Social History:  reports that she has never smoked. She has never used smokeless tobacco. She reports that she drinks alcohol. She reports that she does not use illicit drugs. married Meds PNV All NKDA    Prenatal Transfer Tool  Maternal Diabetes: No Genetic Screening: Normal Maternal Ultrasounds/Referrals: Normal Fetal Ultrasounds or other Referrals:  None Maternal Substance Abuse:  No Significant Maternal Medications:  None Significant Maternal Lab Results:  Lab values include: Group B Strep negative Other Comments:  SUA, IUGR  Review of Systems  Constitutional: Negative.   HENT: Negative.   Eyes: Negative.   Respiratory: Negative.   Cardiovascular: Negative.   Gastrointestinal: Negative.   Genitourinary: Negative.   Musculoskeletal: Negative.   Skin: Negative.   Neurological: Negative.   Psychiatric/Behavioral: Negative.     Dilation: 1 Effacement (%): 50 Station: -2 Exam by:: Susie Nix, RN Temperature 98 F (36.7 C), temperature source Oral, resp. rate 16, height  (1.676 m), weight 68.493 kg (151 lb), last  menstrual period 12/12/2014. Maternal Exam:  Uterine Assessment: Contraction frequency is irregular.   Abdomen: Patient reports no abdominal tenderness. Fundal height is small for gestation.   Estimated fetal weight is 5-6#.   Fetal presentation: vertex  Introitus: Normal vulva. Normal vagina.    Physical Exam  Constitutional: She is oriented to person, place, and time. She appears well-developed and well-nourished.  HENT:  Head: Normocephalic and atraumatic.  Cardiovascular: Normal rate and regular rhythm.   Respiratory: Effort normal and breath sounds normal. No respiratory distress. She has no wheezes.  GI: Soft. Bowel sounds are normal. She exhibits no distension. There is no tenderness.  Musculoskeletal: Normal range of motion.  Neurological: She is alert and oriented to person, place, and time.  Skin: Skin is warm and dry.  Psychiatric: She has a normal mood and affect. Her behavior is normal.    Prenatal labs: ABO, Rh: B/Positive/-- (02/23 0000) Antibody: Negative (02/23 0000) Rubella: Immune (02/23 0000) RPR: Nonreactive, Nonreactive (02/23 0000)  HBsAg: Negative (02/23 0000)  HIV: Non-reactive (02/23 0000)  GBS: Negative, Negative, Negative (08/30 0000)   Hgb 12.5/Plt 213/Ur Cx neg/GC neg/Chl neg/CF neg/AFP WNL/ Panorama WNL female/glucola 162 - nl 3hr GTT  Growth 36 - 13%  Nl anat, low nl AFI Nl dopplers EDC by LMP cw First tri [place] Assessment/Plan: 34yo G1 at 37+ for IOL AROM and pitocin Epidural prn Expect SVD   Bovard-Stuckert, Charl Wellen 09/10/2015, 9:15 AM

## 2015-09-10 NOTE — Anesthesia Procedure Notes (Signed)
Epidural Patient location during procedure: OB  Staffing Anesthesiologist: Mirabel Ahlgren Performed by: anesthesiologist   Preanesthetic Checklist Completed: patient identified, site marked, surgical consent, pre-op evaluation, timeout performed, IV checked, risks and benefits discussed and monitors and equipment checked  Epidural Patient position: sitting Prep: site prepped and draped and DuraPrep Patient monitoring: continuous pulse ox and blood pressure Approach: midline Location: L3-L4 Injection technique: LOR saline  Needle:  Needle type: Tuohy  Needle gauge: 17 G Needle length: 9 cm and 9 Needle insertion depth: 3 cm Catheter type: closed end flexible Catheter size: 19 Gauge Catheter at skin depth: 8 cm Test dose: negative  Assessment Events: blood not aspirated, injection not painful, no injection resistance, negative IV test and no paresthesia  Additional Notes Patient identified. Risks/Benefits/Options discussed with patient including but not limited to bleeding, infection, nerve damage, paralysis, failed block, incomplete pain control, headache, blood pressure changes, nausea, vomiting, reactions to medications, itching and postpartum back pain. Confirmed with bedside nurse the patient's most recent platelet count. Confirmed with patient that they are not currently taking any anticoagulation, have any bleeding history or any family history of bleeding disorders. Patient expressed understanding and wished to proceed. All questions were answered. Sterile technique was used throughout the entire procedure. Please see nursing notes for vital signs. Test dose was given through epidural catheter and negative prior to continuing to dose epidural or start infusion. Warning signs of high block given to the patient including shortness of breath, tingling/numbness in hands, complete motor block, or any concerning symptoms with instructions to call for help. Patient was given  instructions on fall risk and not to get out of bed. All questions and concerns addressed with instructions to call with any issues or inadequate analgesia.

## 2015-09-10 NOTE — Progress Notes (Signed)
Patient ID: Yolanda Williams, female   DOB: 07-05-1981, 34 y.o.   MRN: [id number]  Getting uncomfortable with ctx.  AFVSS gen NAD FHTs 140's, good var, category 1 toco irr, q 3-72min  SVE 2.3/70/-1-2  Continue IOL, with pitocin

## 2015-09-10 NOTE — Anesthesia Preprocedure Evaluation (Addendum)
Anesthesia Evaluation  Patient identified by MRN, date of birth, ID band Patient awake    Reviewed: Allergy & Precautions, H&P , Patient's Chart, lab work & pertinent test results  Airway Mallampati: II  TM Distance: >3 FB Neck ROM: full    Dental no notable dental hx.    Pulmonary neg pulmonary ROS,    Pulmonary exam normal breath sounds clear to auscultation       Cardiovascular negative cardio ROS Normal cardiovascular exam Rhythm:regular Rate:Normal     Neuro/Psych negative neurological ROS  negative psych ROS   GI/Hepatic negative GI ROS, Neg liver ROS,   Endo/Other  negative endocrine ROS  Renal/GU negative Renal ROS  negative genitourinary   Musculoskeletal   Abdominal   Peds  Hematology negative hematology ROS (+)   Anesthesia Other Findings Pregnancy - uncomplicated Platelets and allergies reviewed Denies active cardiac or pulmonary symptoms, METS > 4  Denies blood thinning medications, bleeding disorders, hypertension, asthma, supine hypotension syndrome, previous anesthesia difficulties    Reproductive/Obstetrics (+) Pregnancy                             Anesthesia Physical Anesthesia Plan  ASA: II  Anesthesia Plan: Epidural   Post-op Pain Management:    Induction:   Airway Management Planned:   Additional Equipment:   Intra-op Plan:   Post-operative Plan:   Informed Consent: I have reviewed the patients History and Physical, chart, labs and discussed the procedure including the risks, benefits and alternatives for the proposed anesthesia with the patient or authorized representative who has indicated his/her understanding and acceptance.     Plan Discussed with:   Anesthesia Plan Comments:         Anesthesia Quick Evaluation  

## 2015-09-10 NOTE — Progress Notes (Signed)
Patient ID: Yolanda Williams, female   DOB: 08-03-1981, 34 y.o.   MRN: [id number]   Pt doing well/comfortable with epidural  AFVSS gen NAD FHTs 120-130's, good var, category toco Q2-63min  SVE 10/100/+2  Will push soon. Expect SVD

## 2015-09-11 LAB — CBC
HEMATOCRIT: 35.8 % — AB (ref 36.0–46.0)
Hemoglobin: 11.6 g/dL — ABNORMAL LOW (ref 12.0–15.0)
MCH: 30.4 pg (ref 26.0–34.0)
MCHC: 32.4 g/dL (ref 30.0–36.0)
MCV: 94 fL (ref 78.0–100.0)
Platelets: 165 10*3/uL (ref 150–400)
RBC: 3.81 MIL/uL — ABNORMAL LOW (ref 3.87–5.11)
RDW: 13.5 % (ref 11.5–15.5)
WBC: 15.9 10*3/uL — ABNORMAL HIGH (ref 4.0–10.5)

## 2015-09-11 LAB — RPR: RPR Ser Ql: NONREACTIVE

## 2015-09-11 NOTE — Progress Notes (Signed)
Post Partum Day 1 Subjective: no complaints, up ad lib, tolerating PO and nl lochia, pain controlled  Objective: Blood pressure 112/63, pulse 83, temperature 97.9 F (36.6 C), temperature source Oral, resp. rate 18, height  (1.676 m), weight 151 lb (68.493 kg), last menstrual period 12/12/2014, SpO2 100 %, unknown if currently breastfeeding.  Physical Exam:  General: alert and no distress Lochia: appropriate Uterine Fundus: firm   Recent Labs  09/10/15 0855 09/11/15 0644  HGB 12.6 11.6*  HCT 37.8 35.8*    Assessment/Plan: Plan for discharge tomorrow, Breastfeeding and Lactation consult.  Routine care.     LOS: 1 day   Bovard-Stuckert, Tida Saner 09/11/2015, 7:48 AM

## 2015-09-11 NOTE — Anesthesia Postprocedure Evaluation (Signed)
  Anesthesia Post-op Note  Patient: Yolanda Williams  Procedure(s) Performed: * No procedures listed *  Patient Location: Mother/Baby  Anesthesia Type:Epidural  Level of Consciousness: awake, alert , oriented and patient cooperative  Airway and Oxygen Therapy: Patient Spontanous Breathing  Post-op Pain: mild  Post-op Assessment: Post-op Vital signs reviewed, Patient's Cardiovascular Status Stable, Respiratory Function Stable, Patent Airway, No signs of Nausea or vomiting, Adequate PO intake, Pain level controlled, No headache, No backache and Patient able to bend at knees              Post-op Vital Signs: Reviewed and stable  Last Vitals:  Filed Vitals:   09/11/15 0942  BP: 103/53  Pulse: 91  Temp: 36.8 C  Resp:     Complications: No apparent anesthesia complications

## 2015-09-11 NOTE — Lactation Note (Signed)
This note was copied from the chart of Yolanda Naziyah Tieszen. Lactation Consultation Note Initial visit at 21 hours of age.  Mom reports several good feedings, one recent but baby is still showing feeding cues.  Baby has  Not latched well on right breast yet.  Assisted with football hold, baby latched well with some initial chomping motion quickly transitions into strong rhythmic sucking with audible swallows.  [facility] LC resources given and discussed.  Encouraged to feed with early cues on demand.  Early newborn behavior discussed.  Hand expression demonstrated by mom with colostrum visible.  Mom to call for assist as needed.    Patient Name: Yolanda Williams Today's Date: 09/11/2015 Reason for consult: Initial assessment   Maternal Data Has patient been taught Hand Expression?: Yes Does the patient have breastfeeding experience prior to this delivery?: No  Feeding Feeding Type: Breast Fed Length of feed:  (several minutes observed)  LATCH Score/Interventions Latch: Grasps breast easily, tongue down, lips flanged, rhythmical sucking. Intervention(s): Skin to skin;Teach feeding cues;Waking techniques Intervention(s): Adjust position;Assist with latch;Breast massage;Breast compression  Audible Swallowing: Spontaneous and intermittent  Type of Nipple: Everted at rest and after stimulation  Comfort (Breast/Nipple): Soft / non-tender     Hold (Positioning): Assistance needed to correctly position infant at breast and maintain latch. Intervention(s): Breastfeeding basics reviewed;Support Pillows;Position options;Skin to skin  LATCH Score: 9  Lactation Tools Discussed/Used     Consult Status Consult Status: Follow-up Date: 09/12/15 Follow-up type: In-patient    Yolanda Williams 09/11/2015, 5:37 PM

## 2015-09-12 MED ORDER — IBUPROFEN 800 MG PO TABS: 800.0000 mg | ORAL_TABLET | Freq: Three times a day (TID) | ORAL | Status: DC | PRN

## 2015-09-12 MED ORDER — OXYCODONE-ACETAMINOPHEN 5-325 MG PO TABS: 1.0000 | ORAL_TABLET | Freq: Four times a day (QID) | ORAL | Status: DC | PRN

## 2015-09-12 MED ORDER — PRENATAL MULTIVITAMIN CH: 1.0000 | ORAL_TABLET | Freq: Every day | ORAL | Status: DC

## 2015-09-12 NOTE — Discharge Summary (Signed)
Obstetric Discharge Summary Reason for Admission: induction of labor Prenatal Procedures: none Intrapartum Procedures: spontaneous vaginal delivery Postpartum Procedures: none Complications-Operative and Postpartum: 1st  degree perineal laceration and vaginal laceration HEMOGLOBIN  Date Value Ref Range Status  09/11/2015 11.6* 12.0 - 15.0 g/dL Final  16/09/9603 54.0 12.2 - 16.2 g/dL Final   HCT  Date Value Ref Range Status  09/11/2015 35.8* 36.0 - 46.0 % Final   HCT, POC  Date Value Ref Range Status  01/06/2014 43.3 37.7 - 47.9 % Final    Physical Exam:  General: alert and no distress Lochia: appropriate Uterine Fundus: firm  Discharge Diagnoses: Term Pregnancy-delivered  Discharge Information: Date: 09/12/2015 Activity: pelvic rest Diet: routine Medications: PNV, Ibuprofen and Percocet Condition: stable Instructions: refer to practice specific booklet Discharge to: home Follow-up Information    Follow up with Yolanda Williams, Yolanda Lauter, MD. Schedule an appointment as soon as possible for a visit in 6 weeks.   Specialty:  Obstetrics and Gynecology   Why:  Postpartum check   Contact information:   [address] [place] [address] [phone]       Newborn Data: Live born female  Birth Weight: 6 lb 7.4 oz (2930 g) APGAR: 9, 9  Home with mother.  Yolanda Williams, Soila Printup 09/12/2015, 7:15 AM

## 2015-09-12 NOTE — Progress Notes (Signed)
Post Partum Day 2  Subjective: no complaints, up ad lib, tolerating PO and nl lochia, pain controlled  Objective: Blood pressure 100/58, pulse 79, temperature 98 F (36.7 C), temperature source Oral, resp. rate 18, height  (1.676 m), weight 151 lb (68.493 kg), last menstrual period 12/12/2014, SpO2 97 %, unknown if currently breastfeeding.  Physical Exam:  General: alert and no distress Lochia: appropriate Uterine Fundus: firm   Recent Labs  09/10/15 0855 09/11/15 0644  HGB 12.6 11.6*  HCT 37.8 35.8*    Assessment/Plan: Discharge home, Breastfeeding and Lactation consult.  Routine care.  D.c with motrin, percocet and PNV, f/u 6 weeks   LOS: 2 days   Bovard-Stuckert, Sriman Tally 09/12/2015, 6:22 AM

## 2015-09-12 NOTE — Lactation Note (Signed)
This note was copied from the chart of Yolanda Heaven Meeker. Lactation Consultation Note  Follow up with mom a D/C. Mom reports infant is feeding well and that she feels baby is nursing well on left. Mom reports she is unable to latch infant to right breast. Enc mom to express prior to latch. Mom was aware of positioning and support pillows. Attempted baby in football hold on right side, infant pushing away from breast. Latched baby in cradle hold pretty easily. Baby with rhythmic sucking and intermittent swallows heard. Enc. Mom to pull baby onto breast for deep latch, mom says she has not been pulling baby on as deeply. Infant with QS voids/stools for day of age. Enc 8-12 feeds in 24 hours and keeping record of I/O. Parents to go to [facility] tomorrow and Monday with Ped. LC appt tomorrow. Mom is able to hand express milk, has a hand pump to take home and has a DEBP at home. Engorgement prevention reviewed. Referred to Taking Care of Baby and Me booklet and BF information within. Referred to [facility] handout, parents aware of OP services, BF resources and Support Groups. Has LC # and told to call PRN questions/copncerns.  Patient Name: Yolanda Williams NWGNF'A Date: 09/12/2015 Reason for consult: Follow-up assessment   Maternal Data    Feeding Feeding Type: Breast Fed Length of feed: 15 min  LATCH Score/Interventions Latch: Grasps breast easily, tongue down, lips flanged, rhythmical sucking. Intervention(s): Skin to skin Intervention(s): Adjust position;Assist with latch;Breast compression  Audible Swallowing: Spontaneous and intermittent Intervention(s): Skin to skin  Type of Nipple: Everted at rest and after stimulation  Comfort (Breast/Nipple): Soft / non-tender     Hold (Positioning): Assistance needed to correctly position infant at breast and maintain latch. Intervention(s): Breastfeeding basics reviewed;Support Pillows;Position options;Skin to skin  LATCH Score: 9  Lactation Tools  Discussed/Used Pump Review: Milk Storage   Consult Status Consult Status: Complete    Silas Flood Vicci Reder 09/12/2015, 10:30 AM

## 2015-12-29 DIAGNOSIS — R9431 Abnormal electrocardiogram [ECG] [EKG]: Secondary | ICD-10-CM

## 2015-12-29 HISTORY — DX: Abnormal electrocardiogram (ECG) (EKG): R94.31

## 2016-07-05 ENCOUNTER — Encounter: Payer: Self-pay | Admitting: Emergency Medicine

## 2016-07-05 ENCOUNTER — Emergency Department (INDEPENDENT_AMBULATORY_CARE_PROVIDER_SITE_OTHER)
Admission: EM | Admit: 2016-07-05 | Discharge: 2016-07-05 | Disposition: A | Discharge status: Home / Self Care | Payer: Managed Care, Other (non HMO) | Source: Home / Self Care | Attending: Family Medicine | Admitting: Family Medicine

## 2016-07-05 DIAGNOSIS — R0981 Nasal congestion: Secondary | ICD-10-CM

## 2016-07-05 NOTE — Discharge Instructions (Signed)
Recommend using saline nasal spray several times daily and saline nasal irrigation (AYR is a common brand).   °Try warm salt water gargles for sore throat.  °  °

## 2016-07-05 NOTE — ED Notes (Signed)
Patient states she has had nasal congestion and discomfort for about 10 days. She is lactating and could possibly be pregnant. No recent OTCs.

## 2016-07-05 NOTE — ED Provider Notes (Signed)
CSN: [id number]     Arrival date & time 07/05/16  1202 History   First MD Initiated Contact with Patient 07/05/16 1253     Chief Complaint  Patient presents with  . Nasal Congestion      HPI Comments: Patient reports that she developed a mild sore throat (now resolved), hoarseness, and nasal congestion about 11 days ago.  No cough.  She has had mild frontal headache for about 4 days.  No fevers, chills, and sweats.  She is presently lactating, and may be pregnant.  The history is provided by the patient.    Past Medical History  Diagnosis Date  . Anemia   . 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 IUGR (intrauterine growth restriction) 09/10/2015  . SVD (spontaneous vaginal delivery) 09/10/2015  . Lactating mother    Past Surgical History  Procedure Laterality Date  . Tear duct surgery     Family History  Problem Relation Age of Onset  . Hypertension Mother   . Valvular heart disease Brother     partly fused "cuspid" valves  . Diabetes      paternal side of family   Social History  Substance Use Topics  . Smoking status: Never Smoker   . Smokeless tobacco: Never Used  . Alcohol Use: Yes     Comment: social    OB History    Gravida Para Term Preterm AB TAB SAB Ectopic Multiple Living   1 1 1       0 1     Review of Systems No sore throat at present + hoarse No cough No pleuritic pain No wheezing + nasal congestion + post-nasal drainage + sinus pain/pressure No itchy/red eyes No earache No hemoptysis No SOB No fever/chills No nausea No vomiting No abdominal pain No diarrhea No urinary symptoms No skin rash No fatigue No myalgias + headache    Allergies  Review of patient's allergies indicates no known allergies.  Home Medications   Prior to Admission medications   Medication Sig Start Date End Date Taking? Authorizing Provider  fluticasone (FLONASE) 50 MCG/ACT nasal spray Place into both nostrils daily.   Yes Historical Provider, MD   norethindrone-ethinyl estradiol-iron (MICROGESTIN FE,GILDESS FE,LOESTRIN FE) 1.5-30 MG-MCG tablet Take 1 tablet by mouth daily.   Yes Historical Provider, MD  Prenatal Vit-Fe Fumarate-FA (PRENATAL MULTIVITAMIN) TABS tablet Take 1 tablet by mouth at bedtime. 09/12/15   Sherian ReinJody Bovard-Stuckert, MD   Meds Ordered and Administered this Visit  Medications - No data to display  BP 105/70 mmHg  Pulse 79  Temp(Src) 97.7 F (36.5 C) (Oral)  Resp 16  Ht 5\' 6"  (1.676 m)  Wt 132 lb (59.875 kg)  BMI 21.32 kg/m2  SpO2 100% No data found.   Physical Exam Nursing notes and Vital Signs reviewed. Appearance:  Patient appears stated age, and in no acute distress Eyes:  Pupils are equal, round, and reactive to light and accomodation.  Extraocular movement is intact.  Conjunctivae are not inflamed  Ears:  Canals normal.  Tympanic membranes normal.  Nose:  Mildly congested turbinates.  No sinus tenderness.    Pharynx:  Normal Neck:  Supple.  Tender enlarged posterior/lateral nodes are palpated bilaterally  Lungs:  Clear to auscultation.  Breath sounds are equal.  Moving air well. Heart:  Regular rate and rhythm without murmurs, rubs, or gallops.  Abdomen:  Nontender without masses or hepatosplenomegaly.  Bowel sounds are present.  No CVA or flank tenderness.  Extremities:  No edema.  Skin:  No rash present.   ED Course  Procedures none    MDM   1. Sinus congestion    There is no evidence of bacterial infection today.   Recommend using saline nasal spray several times daily and saline nasal irrigation (AYR is a common brand).  Try warm salt water gargles for sore throat.     Lattie Haw, MD 07/11/16 [id number]

## 2016-08-25 ENCOUNTER — Encounter (INDEPENDENT_AMBULATORY_CARE_PROVIDER_SITE_OTHER): Payer: Self-pay

## 2016-08-25 ENCOUNTER — Ambulatory Visit (INDEPENDENT_AMBULATORY_CARE_PROVIDER_SITE_OTHER): Payer: Managed Care, Other (non HMO) | Admitting: Internal Medicine

## 2016-08-25 ENCOUNTER — Encounter: Payer: Self-pay | Admitting: Internal Medicine

## 2016-08-25 VITALS — BP 93/70 | HR 102 | Ht 66.0 in | Wt 133.0 lb

## 2016-08-25 DIAGNOSIS — R42 Dizziness and giddiness: Secondary | ICD-10-CM

## 2016-08-25 DIAGNOSIS — R9431 Abnormal electrocardiogram [ECG] [EKG]: Secondary | ICD-10-CM | POA: Diagnosis not present

## 2016-08-25 NOTE — Progress Notes (Signed)
OFFICE NOTE  Chief Complaint:  Episodic lightheadedness  Primary Care Physician: Shade Flood, MD  HPI:  Yolanda Williams is a pleasant 35 year old female who is coming referred to me for evaluation of an abnormal EKG which demonstrated a short PR interval. 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 She reports that throughout most of her life she has had episodes of intermittent lightheadedness/nausea which can occur for various reasons throughout the day. Typically she has tried to go to sleep at night and wakes up in the morning feeling better. Over the past several weeks she's had these episodes more frequently and then had a sustained episode which lasted for about one week. The symptoms were present but waxed and waned throughout the days and she just did not feel right. She did not feel presyncopal, but was lightheaded and nauseated. She did not check her pulse rate during these episodes.  She was seen recently by Dr. Neva Seat who did an EKG and noted she did have a short PR interval. She denies palpitations or tachycardia. She continues on birth control and was recently tested for pregnancy and was negative.  08/25/2016  Yolanda Williams returns today for follow-up. She is reporting some episodic right headedness. This is the same symptom we evaluated for her in 2015. She's had this for a long time. She's also had some syncopal episodes. In the interim since we saw her she was married and had a baby. That baby is now 59 year old. We did not monitor her in the past due to her infrequent symptoms however recently she's had more frequent symptoms. A repeat EKG today shows a low atrial rhythm with short PR interval. Orthostatic blood pressures were obtained in the office which showed a change in blood pressure from lying at 100/63 to standing at 93/70 with a heart rate increased from 74-102 - not suggesting significant orthostatic hypotension. She  has not had any further syncopal events. She denies shortness of breath or chest pain.  PMHx:  Past Medical History:  Diagnosis Date  . Anemia   . IUGR (intrauterine growth restriction) 09/10/2015  . Lactating mother   . 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 SVD (spontaneous vaginal delivery) 09/10/2015    Past Surgical History:  Procedure Laterality Date  . tear duct surgery      FAMHx:  Family History  Problem Relation Age of Onset  . Hypertension Mother   . Valvular heart disease Brother     partly fused "cuspid" valves  . Diabetes      paternal side of family    SOCHx:   reports that she has never smoked. She has never used smokeless tobacco. She reports that she drinks alcohol. She reports that she does not use drugs.  ALLERGIES:  No Known Allergies  ROS: Pertinent items noted in HPI and remainder of comprehensive ROS otherwise negative.  HOME MEDS: Current Outpatient Prescriptions  Medication Sig Dispense Refill  . fluticasone (FLONASE) 50 MCG/ACT nasal spray Place into both nostrils daily.    . norethindrone-ethinyl estradiol-iron (MICROGESTIN FE,GILDESS FE,LOESTRIN FE) 1.5-30 MG-MCG tablet Take 1 tablet by mouth daily.    . Prenatal Vit-Fe Fumarate-FA (PRENATAL MULTIVITAMIN) TABS tablet Take 1 tablet by mouth at bedtime. 30 tablet 12   No current facility-administered medications for this visit.     LABS/IMAGING: No results found for this or any previous visit (from the past 48 hour(s)). No results found.  VITALS:  BP 93/70 (BP Location: Right Arm, Patient Position: Standing, Cuff Size: Normal)   Pulse (!) 102   Ht 5\' 6"  (1.676 m)   Wt 133 lb (60.3 kg)   BMI 21.47 kg/m   EXAM: General appearance: alert and no distress, thin, well-dressed Neck: no carotid bruit and no JVD Lungs: clear to auscultation bilaterally Heart: regular rate and rhythm, S1, S2 normal, no murmur, click, rub or gallop Abdomen: soft, non-tender; bowel sounds normal; no masses,  no  organomegaly Extremities: extremities normal, atraumatic, no cyanosis or edema Pulses: 2+ and symmetric Skin: Skin color, texture, turgor normal. No rashes or lesions Neurologic: Grossly normal Psych: Normal mood, affect  EKG: Ectopic atrial rhythm at 77, PR interval less than 60 milliseconds, normal QRS complex (possible LGL variant)  ASSESSMENT: 1. Lightheadedness/nausea/fatigue 2. 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   Yolanda Williams again is having more frequent symptoms that were similar to what we have evaluated her for in the past. This time I would like to obtain an echocardiogram to look for structural changes in her heart as well as place a 30 day monitor to see if she's having any arrhythmic events. She was orthostatic negative today in the office. Plan to see her back to discuss those findings in 4-6 weeks.  Chrystie NoseKenneth C. Corrina Steffensen, MD, [facility] Campus[facility]ACC Attending Cardiologist [facility]  Chrystie NoseKenneth C Dent Plantz 08/25/2016, 5:52 PM

## 2016-08-25 NOTE — Patient Instructions (Addendum)
Your physician has recommended that you wear an event monitor for 30 days. Event monitors are medical devices that record the heart's electrical activity. Doctors most often us these monitors to diagnose arrhythmias. Arrhythmias are problems with the speed or rhythm of the heartbeat. The monitor is a small, portable device. You can wear one while you do your normal daily activities. This is usually used to diagnose what is causing palpitations/syncope (passing out).  Your physician has requested that you have an echocardiogram. Echocardiography is a painless test that uses sound waves to create images of your heart. It provides your doctor with information about the size and shape of your heart and how well your heart's chambers and valves are working. This procedure takes approximately one hour. There are no restrictions for this procedure.  Both the echo & event monitor will be done at our office on [address] [phone]([address] - [address])  Your physician recommends that you schedule a follow-up appointment with Dr. Rennis GoldenHilty after your testing.

## 2016-09-07 ENCOUNTER — Ambulatory Visit (INDEPENDENT_AMBULATORY_CARE_PROVIDER_SITE_OTHER): Payer: Managed Care, Other (non HMO)

## 2016-09-07 ENCOUNTER — Other Ambulatory Visit: Payer: Self-pay

## 2016-09-07 ENCOUNTER — Telehealth: Payer: Self-pay | Admitting: Internal Medicine

## 2016-09-07 ENCOUNTER — Ambulatory Visit (HOSPITAL_COMMUNITY): Payer: Managed Care, Other (non HMO) | Attending: Internal Medicine

## 2016-09-07 DIAGNOSIS — R42 Dizziness and giddiness: Secondary | ICD-10-CM | POA: Diagnosis not present

## 2016-09-07 DIAGNOSIS — R9431 Abnormal electrocardiogram [ECG] [EKG]: Secondary | ICD-10-CM | POA: Insufficient documentation

## 2016-09-07 NOTE — Telephone Encounter (Signed)
error 

## 2016-09-09 ENCOUNTER — Telehealth: Payer: Self-pay | Admitting: Internal Medicine

## 2016-09-09 NOTE — Telephone Encounter (Addendum)
Returned call to patient-unable to locate who or why patient was contacted.  Pt reports 3 missed calls from office, no VM was left.  Apologized for the confusion and inconvenience.  Pt verbalized understanding.     After further review, Bjorn LoserRhonda from billing was attempting to contact patient but unable to leave VM due to VM box full.  Rhonda aware pt returned call and will call patient back.

## 2016-09-09 NOTE — Telephone Encounter (Signed)
New message ° ° ° ° ° ° °Pt returning nurse call  °

## 2016-09-14 ENCOUNTER — Emergency Department (INDEPENDENT_AMBULATORY_CARE_PROVIDER_SITE_OTHER)
Admission: EM | Admit: 2016-09-14 | Discharge: 2016-09-14 | Disposition: A | Discharge status: Home / Self Care | Payer: Managed Care, Other (non HMO) | Source: Home / Self Care | Attending: Family Medicine | Admitting: Family Medicine

## 2016-09-14 ENCOUNTER — Encounter: Payer: Self-pay | Admitting: Emergency Medicine

## 2016-09-14 DIAGNOSIS — J069 Acute upper respiratory infection, unspecified: Secondary | ICD-10-CM | POA: Diagnosis not present

## 2016-09-14 LAB — POCT INFLUENZA A/B
Influenza A, POC: NEGATIVE
Influenza B, POC: NEGATIVE

## 2016-09-14 NOTE — ED Provider Notes (Signed)
CSN: [id number]     Arrival date & time 09/14/16  1518 History   First MD Initiated Contact with Patient 09/14/16 1541     Chief Complaint  Patient presents with  . Fever  . Sore Throat  . Generalized Body Aches   (Consider location/radiation/quality/duration/timing/severity/associated sxs/prior Treatment) HPI  Yolanda Williams is a 35 y.o. female presenting to UC with c/o fever, chills, aches, sore throat, and mild intermittent cough for 2 days.  Fever Tmax 101*F.  Pt notes her toddler had intermittent fever over the last week but was dx with a viral illness. She did take acetaminophen last night but no medications today.  Pt is currently breastfeeding. Denies n/v/d. Denies chest pain or SOB.  Past Medical History:  Diagnosis Date  . Anemia   . IUGR (intrauterine growth restriction) 09/10/2015  . Lactating mother   . 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 SVD (spontaneous vaginal delivery) 09/10/2015   Past Surgical History:  Procedure Laterality Date  . tear duct surgery     Family History  Problem Relation Age of Onset  . Hypertension Mother   . Valvular heart disease Brother     partly fused "cuspid" valves  . Diabetes      paternal side of family   Social History  Substance Use Topics  . Smoking status: Never Smoker  . Smokeless tobacco: Never Used  . Alcohol use Yes     Comment: social    OB History    Gravida Para Term Preterm AB Living   1 1 1     1    SAB TAB Ectopic Multiple Live Births         0 1     Review of Systems  Constitutional: Positive for chills, fatigue and fever.  HENT: Positive for congestion, rhinorrhea and sore throat. Negative for ear pain, trouble swallowing and voice change.   Respiratory: Positive for cough. Negative for shortness of breath.   Cardiovascular: Negative for chest pain and palpitations.  Gastrointestinal: Negative for abdominal pain, diarrhea, nausea and vomiting.  Musculoskeletal: Positive for arthralgias and myalgias.  Negative for back pain.       Body aches  Skin: Negative for rash.    Allergies  Review of patient's allergies indicates no known allergies.  Home Medications   Prior to Admission medications   Medication Sig Start Date End Date Taking? Authorizing Provider  fluticasone (FLONASE) 50 MCG/ACT nasal spray Place into both nostrils daily.    Historical Provider, MD  norethindrone-ethinyl estradiol-iron (MICROGESTIN FE,GILDESS FE,LOESTRIN FE) 1.5-30 MG-MCG tablet Take 1 tablet by mouth daily.    Historical Provider, MD  Prenatal Vit-Fe Fumarate-FA (PRENATAL MULTIVITAMIN) TABS tablet Take 1 tablet by mouth at bedtime. 09/12/15   Sherian ReinJody Bovard-Stuckert, MD   Meds Ordered and Administered this Visit  Medications - No data to display  BP 105/70 (BP Location: Left Arm)   Pulse 100   Temp 99 F (37.2 C) (Oral)   Resp 16   Ht 5\' 5"  (1.651 m)   Wt 132 lb (59.9 kg)   SpO2 100%   BMI 21.97 kg/m  No data found.   Physical Exam  Constitutional: She appears well-developed and well-nourished. No distress.  HENT:  Head: Normocephalic and atraumatic.  Right Ear: Tympanic membrane normal.  Left Ear: Tympanic membrane normal.  Nose: Nose normal.  Mouth/Throat: Uvula is midline, oropharynx is clear and moist and mucous membranes are normal.  Eyes: Conjunctivae are normal. No scleral icterus.  Neck: Normal range  of motion. Neck supple.  Cardiovascular: Normal rate, regular rhythm and normal heart sounds.   Pulmonary/Chest: Effort normal and breath sounds normal. No respiratory distress. She has no wheezes. She has no rales.  Abdominal: Soft. She exhibits no distension. There is no tenderness.  Musculoskeletal: Normal range of motion.  Neurological: She is alert.  Skin: Skin is warm and dry. She is not diaphoretic.  Nursing note and vitals reviewed.   Urgent Care Course   Clinical Course    Procedures (including critical care time)  Labs Review Labs Reviewed  POCT INFLUENZA A/B     Imaging Review No results found.   MDM   1. URI (upper respiratory infection)    Pt c/o URI symptoms for last 2 days with fever Tmax 101*F Lungs: CTAB Temp today: 99*F O2 Sat: 100% on RA  Rapid flu: Negative  Symptoms likely viral. Advised pt to use acetaminophen and ibuprofen as needed for fever and pain. Encouraged rest and fluids. F/u with PCP in 7-10 days if not improving, sooner if worsening. Pt verbalized understanding and agreement with tx plan.     Junius Finner, PA-C 09/14/16 1644

## 2016-09-14 NOTE — ED Triage Notes (Signed)
Patient reports concern over fever, aches, chills and sore throat past 2 days; her toddler had intermittent fever over past week but was diagnosed with viral condition.No OTCs since last night.Patient is breast feeling.

## 2016-10-14 ENCOUNTER — Encounter: Payer: Self-pay | Admitting: Internal Medicine

## 2016-10-14 ENCOUNTER — Ambulatory Visit (INDEPENDENT_AMBULATORY_CARE_PROVIDER_SITE_OTHER): Payer: Managed Care, Other (non HMO) | Admitting: Internal Medicine

## 2016-10-14 VITALS — BP 103/62 | HR 77 | Ht 66.0 in | Wt 134.6 lb

## 2016-10-14 DIAGNOSIS — R9431 Abnormal electrocardiogram [ECG] [EKG]: Secondary | ICD-10-CM | POA: Diagnosis not present

## 2016-10-14 DIAGNOSIS — R42 Dizziness and giddiness: Secondary | ICD-10-CM

## 2016-10-14 NOTE — Patient Instructions (Signed)
Medication Instructions:  Your physician recommends that you continue on your current medications as directed. Please refer to the Current Medication list given to you today.  Labwork: None   Testing/Procedures: None   Follow-Up: Your physician recommends that you schedule a follow-up appointment in: AS NEEDED  Any Other Special Instructions Will Be Listed Below (If Applicable).     If you need a refill on your cardiac medications before your next appointment, please call your pharmacy.  

## 2016-10-14 NOTE — Progress Notes (Signed)
OFFICE NOTE  Chief Complaint:  Follow-up echo, monitor  Primary Care Physician: Shade Flood, MD  HPI:  Yolanda Williams is a pleasant 35 year old female who is coming referred to me for evaluation of an abnormal EKG which demonstrated a short PR interval. 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 She reports that throughout most of her life she has had episodes of intermittent lightheadedness/nausea which can occur for various reasons throughout the day. Typically she has tried to go to sleep at night and wakes up in the morning feeling better. Over the past several weeks she's had these episodes more frequently and then had a sustained episode which lasted for about one week. The symptoms were present but waxed and waned throughout the days and she just did not feel right. She did not feel presyncopal, but was lightheaded and nauseated. She did not check her pulse rate during these episodes.  She was seen recently by Dr. Neva Seat who did an EKG and noted she did have a short PR interval. She denies palpitations or tachycardia. She continues on birth control and was recently tested for pregnancy and was negative.  08/25/2016  Yolanda Williams returns today for follow-up. She is reporting some episodic right headedness. This is the same symptom we evaluated for her in 2015. She's had this for a long time. She's also had some syncopal episodes. In the interim since we saw her she was married and had a baby. That baby is now 37 year old. We did not monitor her in the past due to her infrequent symptoms however recently she's had more frequent symptoms. A repeat EKG today shows a low atrial rhythm with short PR interval. Orthostatic blood pressures were obtained in the office which showed a change in blood pressure from lying at 100/63 to standing at 93/70 with a heart rate increased from 74-102 - not suggesting significant orthostatic hypotension. She has  not had any further syncopal events. She denies shortness of breath or chest pain.  10/14/2016  Yolanda Williams returns today for follow-up. She wore a monitor for the past month which was normal. There was no evidence of any abnormal rhythms, however she reports she had no events over the past month either. She did have an echocardiogram which showed normal systolic and diastolic function which is very reassuring. We did discuss about possible ways that she could help event these events including liberalizing salt in her diet. Although blood pressure tends to run typically normal and she is not demonstrated significant orthostatic hypotension in the office, she could have these episodes periodically. There is little evidence to suggest increasing her blood pressure since it is in the normal range at this point.  PMHx:  Past Medical History:  Diagnosis Date  . Anemia   . IUGR (intrauterine growth restriction) 09/10/2015  . Lactating mother   . 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 SVD (spontaneous vaginal delivery) 09/10/2015    Past Surgical History:  Procedure Laterality Date  . tear duct surgery      FAMHx:  Family History  Problem Relation Age of Onset  . Hypertension Mother   . Valvular heart disease Brother     partly fused "cuspid" valves  . Diabetes      paternal side of family    SOCHx:   reports that she has never smoked. She has never used smokeless tobacco. She reports that she drinks alcohol. She reports that she  does not use drugs.  ALLERGIES:  No Known Allergies  ROS: Pertinent items noted in HPI and remainder of comprehensive ROS otherwise negative.  HOME MEDS: Current Outpatient Prescriptions  Medication Sig Dispense Refill  . fluticasone (FLONASE) 50 MCG/ACT nasal spray Place into both nostrils daily.    . norethindrone-ethinyl estradiol-iron (MICROGESTIN FE,GILDESS FE,LOESTRIN FE) 1.5-30 MG-MCG tablet Take 1 tablet by mouth daily.    . Prenatal Vit-Fe  Fumarate-FA (PRENATAL MULTIVITAMIN) TABS tablet Take 1 tablet by mouth at bedtime. 30 tablet 12   No current facility-administered medications for this visit.     LABS/IMAGING: No results found for this or any previous visit (from the past 48 hour(s)). No results found.  VITALS: BP 103/62   Pulse 77   Ht 5\' 6"  (1.676 m)   Wt 134 lb 9.6 oz (61.1 kg)   BMI 21.73 kg/m   EXAM: Deferred  EKG: Deferred  ASSESSMENT: 1. Episodic dizziness - ?orthostatic hypotension 2. Short PR interval on EKG  PLAN: 1.   Yolanda Williams had 2 episodes of episodic dizziness without symptoms of palpitations or tachycardia. Over the past month of monitoring however she had none in her monitor was normal. Her echocardiogram also is normal. It is difficult that we have been unable to pick up on her events, but there may been evidence of orthostasis. I suggested she could consider liberalizing salt in her diet. I do not see evidence to add medications to raise blood pressure at this time. Although her EKG is mildly abnormal, my suspicion for an arrhythmia is very low. She was taught maneuvers to help raise blood pressure as well as counseled to either sit down or lie down if she were to have episodes where she feels that she is going to pass out.  Follow-up with me as needed.  Chrystie NoseKenneth C. Hilty, MD, [facility] Center[facility]ACC Attending Cardiologist [facility]  Chrystie NoseKenneth C Hilty 10/14/2016, 5:48 PM

## 2016-12-24 ENCOUNTER — Emergency Department (INDEPENDENT_AMBULATORY_CARE_PROVIDER_SITE_OTHER)
Admission: EM | Admit: 2016-12-24 | Discharge: 2016-12-24 | Disposition: A | Discharge status: Home / Self Care | Payer: Managed Care, Other (non HMO) | Source: Home / Self Care | Attending: Family Medicine | Admitting: Family Medicine

## 2016-12-24 ENCOUNTER — Encounter: Payer: Self-pay | Admitting: *Deleted

## 2016-12-24 ENCOUNTER — Other Ambulatory Visit: Payer: Self-pay

## 2016-12-24 ENCOUNTER — Emergency Department (INDEPENDENT_AMBULATORY_CARE_PROVIDER_SITE_OTHER): Payer: Managed Care, Other (non HMO)

## 2016-12-24 DIAGNOSIS — R079 Chest pain, unspecified: Secondary | ICD-10-CM

## 2016-12-24 DIAGNOSIS — M940 Chondrocostal junction syndrome [Tietze]: Secondary | ICD-10-CM | POA: Diagnosis not present

## 2016-12-24 NOTE — Discharge Instructions (Signed)
Apply ice pack for 20 to 30 minutes, 3 to 4 times daily  Continue until pain and swelling decrease.  May take Ibuprofen 200mg, 4 tabs every 8 hours with food.  

## 2016-12-24 NOTE — ED Provider Notes (Signed)
[facility]    CSN: [id number] Arrival date & time: 12/24/16  1211     History   Chief Complaint Chief Complaint  Patient presents with  . Chest Pain    HPI Yolanda Williams is a 35 y.o. female.   Patient complains of one week history of sharp intermittent pain in her left anterior chest under her breast.  No recent URI.  Today she stood up and had recurrent sharp left anterior chest pain.  No shortness of breath.   The history is provided by the patient.  Chest Pain  Pain location:  L chest Pain quality: sharp   Pain radiates to:  Does not radiate Pain severity:  Moderate Onset quality:  Sudden Duration:  1 week Timing:  Intermittent Progression:  Unchanged Chronicity:  Recurrent Context: movement   Relieved by:  Nothing Worsened by:  Certain positions and movement Ineffective treatments:  None tried Associated symptoms: no abdominal pain, no AICD problem, no back pain, no cough, no diaphoresis, no dysphagia, no fatigue, no fever, no heartburn, no lower extremity edema, no nausea, no near-syncope, no numbness, no orthopnea, no palpitations, no shortness of breath and no weakness   Risk factors: birth control   Risk factors: no prior DVT/PE     Past Medical History:  Diagnosis Date  . Anemia   . IUGR (intrauterine growth restriction) 09/10/2015  . Lactating mother   . 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 SVD (spontaneous vaginal delivery) 09/10/2015    Patient Active Problem List   Diagnosis Date Noted  . Dizziness 08/25/2016  . Abnormal EKG 08/25/2016  . Pregnancy 09/10/2015  . 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 Elderly primigravida in third trimester 09/10/2015  . IUGR (intrauterine growth restriction) 09/10/2015  . SVD (spontaneous vaginal delivery) 09/10/2015  . Lightheadedness 01/15/2014  . 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 tear duct surgery      OB History    Gravida Para Term Preterm AB Living   1 1 1     1    SAB TAB Ectopic Multiple Live Births         0 1       Home Medications    Prior to Admission medications   Medication Sig Start Date End Date Taking? Authorizing Provider  fluticasone (FLONASE) 50 MCG/ACT nasal spray Place into both nostrils daily.    Historical Provider, MD  norethindrone-ethinyl estradiol-iron (MICROGESTIN FE,GILDESS FE,LOESTRIN FE) 1.5-30 MG-MCG tablet Take 1 tablet by mouth daily.    Historical Provider, MD  Prenatal Vit-Fe Fumarate-FA (PRENATAL MULTIVITAMIN) TABS tablet Take 1 tablet by mouth at bedtime. 09/12/15   Sherian ReinJody Bovard-Stuckert, MD    Family History Family History  Problem Relation Age of Onset  . Hypertension Mother   . Valvular heart disease Brother     partly fused "cuspid" valves  . Diabetes      paternal side of family    Social History Social History  Substance Use Topics  . Smoking status: Never Smoker  . Smokeless tobacco: Never Used  . Alcohol use No     Comment: social      Allergies   Patient has no known allergies.   Review of Systems Review of Systems  Constitutional: Negative for diaphoresis, fatigue and fever.  HENT: Negative for trouble swallowing.   Respiratory: Negative for cough and shortness of breath.   Cardiovascular: Positive for chest pain. Negative for palpitations, orthopnea  and near-syncope.  Gastrointestinal: Negative for abdominal pain, heartburn and nausea.  Musculoskeletal: Negative for back pain.  Neurological: Negative for weakness and numbness.     Physical Exam Triage Vital Signs ED Triage Vitals  Enc Vitals Group     BP 12/24/16 1248 144/83     Pulse Rate 12/24/16 1248 86     Resp 12/24/16 1248 16     Temp 12/24/16 1248 98 F (36.7 C)     Temp Source 12/24/16 1248 Oral     SpO2 12/24/16 1248 100 %     Weight 12/24/16 1249 133 lb (60.3 kg)     Height 12/24/16 1249 5\' 6"  (1.676 m)     Head Circumference --      Peak Flow --      Pain  Score 12/24/16 1250 0     Pain Loc --      Pain Edu? --      Excl. in GC? --    No data found.   Updated Vital Signs BP 144/83 (BP Location: Left Arm)   Pulse 86   Temp 98 F (36.7 C) (Oral)   Resp 16   Ht 5\' 6"  (1.676 m)   Wt 133 lb (60.3 kg)   LMP 12/10/2016   SpO2 100%   BMI 21.47 kg/m   Visual Acuity Right Eye Distance:   Left Eye Distance:   Bilateral Distance:    Right Eye Near:   Left Eye Near:    Bilateral Near:     Physical Exam  Constitutional: She appears well-developed and well-nourished. No distress.  HENT:  Head: Normocephalic.  Right Ear: External ear normal.  Left Ear: External ear normal.  Nose: Nose normal.  Mouth/Throat: Oropharynx is clear and moist.  Eyes: Conjunctivae and EOM are normal. Pupils are equal, round, and reactive to light.  Neck: Normal range of motion. Neck supple.  Cardiovascular: Normal heart sounds.   Pulmonary/Chest: Breath sounds normal. She exhibits tenderness.    Patient's area of pain marked on diagram; there is minimal tenderness to palpation at this location.  Abdominal: There is no tenderness.  Musculoskeletal: She exhibits no edema or tenderness.  Lymphadenopathy:    She has no cervical adenopathy.  Neurological: She is alert.  Skin: Skin is warm and dry. No Williams noted.  Nursing note and vitals reviewed.    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  No change from EKG done 25 August 2016       Radiology Dg Ribs Unilateral W/chest Left  Result Date: 12/24/2016 CLINICAL DATA:  Left anterior inferior chest pain 1 week, positional. No injury EXAM: LEFT RIBS AND CHEST - 3+ VIEW COMPARISON:  None. FINDINGS:  No fracture or other bone lesions are seen involving the ribs. There is no evidence of pneumothorax or pleural effusion. Both lungs are clear. Heart size and mediastinal contours are within normal limits. IMPRESSION: Negative. Electronically Signed   By: Marlan Palauharles  Clark M.D.   On: 12/24/2016 13:59    Procedures Procedures (including critical care time)  Medications Ordered in UC Medications - No data to display   Initial Impression / Assessment and Plan / UC  Course  I have reviewed the triage vital signs and the nursing notes.  Pertinent labs & imaging results that were available during my care of the patient were reviewed by me and considered in my medical decision making (see chart for details).  Clinical Course   No evidence ACS or PE> Apply ice pack for 20 to 30 minutes, 3 to 4 times daily  Continue until pain and swelling decrease.  May take Ibuprofen 200mg , 4 tabs every 8 hours with food.  Followup with Family Doctor if not improved in two weeks.     Final Clinical Impressions(s) / UC Diagnoses   Final diagnoses:  Costochondritis    New Prescriptions New Prescriptions   No medications on file     Lattie Haw, MD 01/10/17 1553

## 2016-12-24 NOTE — ED Triage Notes (Signed)
Pt c/o sharp intermittent pain under her LT breast x 1 wk. Denies recent URI. No pain currently.

## 2017-01-26 ENCOUNTER — Ambulatory Visit (INDEPENDENT_AMBULATORY_CARE_PROVIDER_SITE_OTHER): Payer: Managed Care, Other (non HMO) | Admitting: Osteopathic Medicine

## 2017-01-26 ENCOUNTER — Encounter: Payer: Self-pay | Admitting: Osteopathic Medicine

## 2017-01-26 ENCOUNTER — Ambulatory Visit (INDEPENDENT_AMBULATORY_CARE_PROVIDER_SITE_OTHER): Payer: Managed Care, Other (non HMO)

## 2017-01-26 VITALS — BP 146/70 | HR 106 | Ht 66.0 in | Wt 137.0 lb

## 2017-01-26 DIAGNOSIS — G8929 Other chronic pain: Secondary | ICD-10-CM

## 2017-01-26 DIAGNOSIS — R1032 Left lower quadrant pain: Secondary | ICD-10-CM

## 2017-01-26 DIAGNOSIS — I456 Pre-excitation syndrome: Secondary | ICD-10-CM | POA: Diagnosis not present

## 2017-01-26 DIAGNOSIS — R42 Dizziness and giddiness: Secondary | ICD-10-CM | POA: Diagnosis not present

## 2017-01-26 DIAGNOSIS — R1031 Right lower quadrant pain: Secondary | ICD-10-CM

## 2017-01-26 DIAGNOSIS — M542 Cervicalgia: Secondary | ICD-10-CM

## 2017-01-26 DIAGNOSIS — M546 Pain in thoracic spine: Secondary | ICD-10-CM | POA: Diagnosis not present

## 2017-01-26 LAB — POCT URINE PREGNANCY: Preg Test, Ur: NEGATIVE

## 2017-01-26 MED ORDER — MELOXICAM 7.5 MG PO TABS: 7.5000 mg | ORAL_TABLET | Freq: Every day | ORAL | 0 refills | Status: DC

## 2017-01-26 NOTE — Progress Notes (Signed)
HPI: Yolanda Williams is a 36 y.o. female  who [facility], 01/[facility],  for chief complaint of:  Chief Complaint  Patient presents with  . Establish Care     MUSCULOSKELETAL/RHEUM C/o back pain - thinks possibly from holding toddler, location is R upper back. Has tried 7-8 sessions of PT, tried dry needling. Has been going on 6-8 months.  Still breast-feeding child who is about 8620-month-old. Carrying the child a lot, thinks this may have something to do with back pain. Associated with some neck pain/strain. Associated with occasional sharp pains in chest below the left breast, this was happening in 11/2016, dx costochondritis. Got XR for this which was negative   CARDIOVASCULAR Following with cardiology - dizziness/syncope, neg w/u w/ monitor, echo. EKG w/ short PR. Planning to f/u as needed. Seen by UC recently for costochondritis, Last seen by Dr. Rennis GoldenHilty 09/2016, Urgent care visit w/ Dr. Cathren HarshBeese 11/2016.  records reviewed and summarized as above.   GASTROINTESTINAL Occasional abdominal cramping. Is having subtle cramping type pain, "feels like something is there" in lower abdomen, unrelated to menstrual cycle. No Hx abd surgery or C-section. No D+/C- no blood in stool. No abnormal vaginal discharge, no dysuria or urinary frequency.    Past medical, surgical, social and family history reviewed: Patient Active Problem List   Diagnosis Date Noted  . Dizziness 08/25/2016  . Abnormal EKG 08/25/2016  . Pregnancy 09/10/2015  . 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 Elderly primigravida in third trimester 09/10/2015  . IUGR (intrauterine growth restriction) 09/10/2015  . SVD (spontaneous vaginal delivery) 09/10/2015  . Lightheadedness 01/15/2014  . 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 tear duct surgery     Social History  Substance Use Topics  . Smoking status: Never  Smoker  . Smokeless tobacco: Never Used  . Alcohol use No     Comment: social    Family History  Problem Relation Age of Onset  . Hypertension Mother   . Valvular heart disease Brother     partly fused "cuspid" valves  . Diabetes      paternal side of family     Current medication list and allergy/intolerance information reviewed:   Current Outpatient Prescriptions  Medication Sig Dispense Refill  . Prenatal Vit-Fe Fumarate-FA (PRENATAL MULTIVITAMIN) TABS tablet Take 1 tablet by mouth at bedtime. 30 tablet 12  . meloxicam (MOBIC) 7.5 MG tablet Take 1 tablet (7.5 mg total) by mouth daily. 30 tablet 0   No current facility-administered medications for this visit.    No Known Allergies    Review of Systems:  Constitutional:  No  fever, no chills, No recent illness, No unintentional weight changes. No significant fatigue.   HEENT: No  headache, no vision change, no hearing change, No sore throat, No  sinus pressure  Cardiac: No  chest pain, No  pressure, No palpitations, No  Orthopnea  Respiratory:  No  shortness of breath. No  Cough  Gastrointestinal: No  abdominal pain, No  nausea, No  vomiting,  No  blood in stool, No  diarrhea, No  constipation   Musculoskeletal: +new myalgia/arthralgia  Genitourinary: No  incontinence, No  abnormal genital bleeding, No abnormal genital discharge  Skin: No  Rash, No other wounds/concerning lesions  Hem/Onc: No  easy bruising/bleeding, No  abnormal lymph node  Endocrine: No cold intolerance,  No heat intolerance. No polyuria/polydipsia/polyphagia   Neurologic: No  weakness, No  dizziness, No  slurred speech/focal weakness/facial droop  Psychiatric: No  concerns with depression, No  concerns with anxiety, No sleep problems, No mood problems  Exam:  BP (!) 146/70   Pulse (!) 106   Ht 5\' 6"  (1.676 m)   Wt 137 lb (62.1 kg)   LMP 12/30/2016   BMI 22.11 kg/m   Constitutional: VS see above. General Appearance: alert,  well-developed, well-nourished, NAD  Eyes: Normal lids and conjunctive, non-icteric sclera  Ears, Nose, Mouth, Throat: MMM, Normal external inspection ears/nares/mouth/lips/gums.   Neck: No masses, trachea midline. No thyroid enlargement. No tenderness/mass appreciated. No lymphadenopathy  Respiratory: Normal respiratory effort. no wheeze, no rhonchi, no rales  Cardiovascular: S1/S2 normal, no murmur, no rub/gallop auscultated. RRR. No lower extremity edema.   Gastrointestinal: Nontender, no masses. No hepatomegaly, no splenomegaly. No hernia appreciated. Bowel sounds normal. Rectal exam deferred.   Musculoskeletal: Gait normal. No clubbing/cyanosis of digits. Symmetrical rib motion with inhalation/exhalation. Ribs/chest nontender. No paraspinal tenderness or midline tenderness. Normal flexion/extension of thoracic spine and cervical spine.  Neurological: Normal balance/coordination. No tremor. No cranial nerve deficit on limited exam. Motor and sensation intact and symmetric. Cerebellar reflexes intact.   Skin: warm, dry, intact. No rash/ulcer. No concerning nevi or subq nodules on limited exam.    Psychiatric: Normal judgment/insight. Normal mood and affect. Oriented x3.    Recent rib x-ray personally reviewed. Difficult to ascertain if mild spinal curvature versus postural change, meaning a bit toward the right side/convexity to left.  No results found.   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     Visit summary with medication list and pertinent instructions was printed for patient to review. All questions at time of visit were answered - patient instructed to contact office with any additional concerns. ER/RTC precautions were reviewed with the patient. Follow-up plan: Return if symptoms worsen or fail to improve. And when due for annual checkup

## 2017-01-26 NOTE — Patient Instructions (Addendum)
Call if antiinflammatories & home exercises are not helpful - we can try osteopathic treatments or yo ucan go back to PT or try acupuncture.

## 2017-01-29 ENCOUNTER — Telehealth: Payer: Self-pay | Admitting: Osteopathic Medicine

## 2017-01-29 NOTE — Telephone Encounter (Signed)
Information has been updated in the chart. Tessi Eustache,CMA

## 2017-01-29 NOTE — Telephone Encounter (Signed)
Patient got flu shot in Oct 2017 at [facility]

## 2017-05-13 ENCOUNTER — Emergency Department (HOSPITAL_BASED_OUTPATIENT_CLINIC_OR_DEPARTMENT_OTHER)
Admission: EM | Admit: 2017-05-13 | Discharge: 2017-05-13 | Discharge status: Against Medical Advice | Payer: Managed Care, Other (non HMO)

## 2017-05-13 NOTE — ED Triage Notes (Signed)
Pt states she is going to leave because it "is a long wait," states she has a doctors appt at 0800 in the morning for her complaint

## 2017-08-27 LAB — HM PAP SMEAR: HM PAP: NORMAL

## 2017-11-16 ENCOUNTER — Encounter: Payer: Self-pay | Admitting: Family Medicine

## 2017-11-16 ENCOUNTER — Ambulatory Visit: Payer: Managed Care, Other (non HMO) | Admitting: Family Medicine

## 2017-11-16 ENCOUNTER — Ambulatory Visit (INDEPENDENT_AMBULATORY_CARE_PROVIDER_SITE_OTHER): Payer: Managed Care, Other (non HMO) | Admitting: Family Medicine

## 2017-11-16 VITALS — BP 112/67 | HR 84 | Temp 97.9°F | Resp 20 | Ht 66.0 in | Wt 137.5 lb

## 2017-11-16 DIAGNOSIS — Z0000 Encounter for general adult medical examination without abnormal findings: Secondary | ICD-10-CM

## 2017-11-16 DIAGNOSIS — Z7689 Persons encountering health services in other specified circumstances: Secondary | ICD-10-CM | POA: Diagnosis not present

## 2017-11-16 DIAGNOSIS — D509 Iron deficiency anemia, unspecified: Secondary | ICD-10-CM | POA: Diagnosis not present

## 2017-11-16 DIAGNOSIS — Z131 Encounter for screening for diabetes mellitus: Secondary | ICD-10-CM | POA: Diagnosis not present

## 2017-11-16 LAB — CBC WITH DIFFERENTIAL/PLATELET
Basophils Absolute: 0 10*3/uL (ref 0.0–0.1)
Basophils Relative: 0.7 % (ref 0.0–3.0)
EOS ABS: 0.1 10*3/uL (ref 0.0–0.7)
Eosinophils Relative: 2.6 % (ref 0.0–5.0)
HEMATOCRIT: 38.7 % (ref 36.0–46.0)
Hemoglobin: 12.7 g/dL (ref 12.0–15.0)
LYMPHS ABS: 2.1 10*3/uL (ref 0.7–4.0)
Lymphocytes Relative: 36.8 % (ref 12.0–46.0)
MCHC: 32.9 g/dL (ref 30.0–36.0)
MCV: 94.3 fl (ref 78.0–100.0)
MONO ABS: 0.4 10*3/uL (ref 0.1–1.0)
Monocytes Relative: 7.1 % (ref 3.0–12.0)
Neutro Abs: 3 10*3/uL (ref 1.4–7.7)
Neutrophils Relative %: 52.8 % (ref 43.0–77.0)
PLATELETS: 191 10*3/uL (ref 150.0–400.0)
RBC: 4.1 Mil/uL (ref 3.87–5.11)
RDW: 13.7 % (ref 11.5–15.5)
WBC: 5.6 10*3/uL (ref 4.0–10.5)

## 2017-11-16 LAB — BASIC METABOLIC PANEL
BUN: 12 mg/dL (ref 6–23)
CALCIUM: 9.7 mg/dL (ref 8.4–10.5)
CO2: 32 meq/L (ref 19–32)
CREATININE: 0.59 mg/dL (ref 0.40–1.20)
Chloride: 102 mEq/L (ref 96–112)
GFR: 121.62 mL/min (ref >60.00)
Glucose, Bld: 90 mg/dL (ref 70–99)
Potassium: 4 mEq/L (ref 3.5–5.1)
Sodium: 139 mEq/L (ref 135–145)

## 2017-11-16 LAB — HEMOGLOBIN A1C: HEMOGLOBIN A1C: 5.2 % (ref 4.6–6.5)

## 2017-11-16 NOTE — Patient Instructions (Signed)

## 2017-11-16 NOTE — Progress Notes (Signed)
Patient ID: Yolanda Williams, female  DOB: 1981-02-21, 36 y.o.   MRN: [id number] Patient Care Team    Relationship Specialty Notifications Start End  Shade FloodGreene, Yolanda R, MD PCP - General Family Medicine  01/15/14   Sherian Williams, Jody, MD Consulting Physician Obstetrics and Gynecology  11/16/17   Chrystie NoseHilty, Yolanda C, MD Consulting Physician Cardiology  11/16/17     Chief Complaint  Patient presents with  . Establish Care    Subjective:  Yolanda Williams is a 36 y.o.  female present for new patient establishment. All past medical history, surgical history, allergies, family history, immunizations, medications and social history were obtained and entered in the electronic medical record today. All recent labs, ED visits and hospitalizations within the last year were reviewed.  Health maintenance:  Colonoscopy: no fhx, screen at 50. Mammogram: No fhx. Screen at 40. Cervical cancer screening: last pap: 04/2017, results: pt reports normal , completed by: Dr. Ellyn Williams Immunizations: tdap 06/27/2015 UTD, Influenza UTD 09/2017  Infectious disease screening: HIV completed DEXA: screen at 60-65 Assistive device: none Oxygen WUJ:WJXBuse:none Patient has a Dental home. Hospitalizations/ED visits:Reviewed  [facility] 11/16/2017  Decreased Interest 0  Down, Depressed, Hopeless 0  PHQ - 2 Score 0   No flowsheet data found.    Current Exercise Habits: The patient does not participate in regular exercise at present Exercise limited by: None identified No flowsheet data found.   Immunization History  Administered Date(s) Administered  . Influenza Split 09/27/2012, 08/13/2015, 10/09/2017  . Influenza-Unspecified 10/05/2011, 10/13/2012, 10/11/2016  . Tdap 06/27/2015    No exam data present  Past Medical History:  Diagnosis Date  . Anemia   . Hx of syncope   . IUGR (intrauterine growth restriction) 09/10/2015  . Shortened PR interval 2017   seen cardiology, no meds needed.      No Known Allergies Past Surgical History:  Procedure Laterality Date  . tear duct surgery     Family History  Problem Relation Age of Onset  . Hypertension Mother   . Miscarriages / [place] Mother   . Valvular heart disease Brother        partly fused "cuspid" valves  . Diabetes Unknown        paternal side of family   Social History   Socioeconomic History  . Marital status: Married    Spouse name: Not on file  . Number of children: Not on file  . Years of education: Not on file  . Highest education level: Not on file  Social Needs  . Financial resource strain: Not on file  . Food insecurity - worry: Not on file  . Food insecurity - inability: Not on file  . Transportation needs - medical: Not on file  . Transportation needs - non-medical: Not on file  Occupational History  . Not on file  Tobacco Use  . Smoking status: Never Smoker  . Smokeless tobacco: Never Used  Substance and Sexual Activity  . Alcohol use: No    Comment: social   . Drug use: No  . Sexual activity: Yes    Partners: Male    Birth control/protection: None    Comment: Married  Other Topics Concern  . Not on file  Social History Narrative   Married.    Master's Degree. 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    Takes a daily vitamin.    Smoke alarm in the home. Wears her seatbelt.    Feels safe in her relationships.  Allergies as of 11/16/2017   No Known Allergies     Medication List        Accurate as of 11/16/17  3:07 PM. Always use your most recent med list.          MULTIVITAMIN ADULT PO Take 1 tablet by mouth daily.       All past medical history, surgical history, allergies, family history, immunizations andmedications were updated in the EMR today and reviewed under the history and medication portions of their EMR.    No results found for this or any previous visit (from the past 2160 hour(s)).  Dg Ribs Unilateral W/chest Left  Result Date: 12/24/2016 CLINICAL DATA:   Left anterior inferior chest pain 1 week, positional. No injury EXAM: LEFT RIBS AND CHEST - 3+ VIEW COMPARISON:  None. FINDINGS: No fracture or other bone lesions are seen involving the ribs. There is no evidence of pneumothorax or pleural effusion. Both lungs are clear. Heart size and mediastinal contours are within normal limits. IMPRESSION: Negative. Electronically Signed   By: Yolanda Williams M.D.   On: 12/24/2016 13:59    ROS: 14 pt review of systems performed and negative (unless mentioned in an HPI)  Objective: BP 112/67 (BP Location: Right Arm, Patient Position: Sitting, Cuff Size: Normal)   Pulse 84   Temp 97.9 F (36.6 C)   Resp 20   Ht 5\' 6"  (1.676 m)   Wt 137 lb 8 oz (62.4 kg)   LMP 10/29/2017   SpO2 98%   BMI 22.19 kg/m  Gen: Afebrile. No acute distress. Nontoxic in appearance, well-developed, well-nourished,  pleasant caucasian female.  HENT: AT. [place]. Bilateral TM visualized and normal in appearance, normal external auditory canal. MMM, no oral lesions, adequate dentition. Bilateral nares within normal limits. Throat without erythema, ulcerations or exudates. no Cough on exam, no hoarseness on exam. Eyes:Pupils Equal Round Reactive to light, Extraocular movements intact,  Conjunctiva without redness, discharge or icterus. Neck/lymp/endocrine: Supple,no lymphadenopathy, no thyromegaly CV: RRR no murmur, no edema, +2/4 P posterior tibialis pulses. no carotid bruits. No JVD. Chest: CTAB, no wheeze, rhonchi or crackles. normal Respiratory effort. good Air movement. Abd: Soft. flat. NTND. BS present. no Masses palpated. No hepatosplenomegaly. No rebound tenderness or guarding. Skin: x1 0.5cm flesh toned stuck on appearance flaky skin lesion midline back. no rashes, purpura or petechiae. Warm and well-perfused. Skin intact. Neuro/Msk:  Normal gait. PERLA. EOMi. Alert. Oriented x3.  Cranial nerves II through XII intact. Muscle strength 5/5 upper/lower extremity. DTRs equal  bilaterally. Psych: Normal affect, dress and demeanor. Normal speech. Normal thought content and judgment.   Assessment/plan: [name] is a 36 y.o. female present for establishment of care.  Diabetes mellitus screening - HgB A1c  Iron deficiency anemia, unspecified iron deficiency anemia type - low in 2016 and as a teenager. Currently only on a prenatal vitamin  - CBC w/Diff - Basic Metabolic Panel (BMET) Encounter for preventive health examination Patient was encouraged to exercise greater than 150 minutes a week. Patient was encouraged to choose a diet filled with fresh fruits and vegetables, and lean meats. AVS provided to patient today for education/recommendation on gender specific health and safety maintenance. Colonoscopy: no fhx, screen at 50. Mammogram: No fhx. Screen at 40. Cervical cancer screening: last pap: 04/2017, results: pt reports normal , completed by: Dr. Ellyn Hack Immunizations: tdap 06/27/2015 UTD, Influenza UTD 09/2017  Infectious disease screening: HIV completed DEXA: screen at 60-65 Skin lesion mid back consistent with SK- not  concerning reassured pt.  Return in about 1 year (around 11/16/2018) for CPE.   Note is dictated utilizing voice recognition software. Although note has been proof read prior to signing, occasional typographical errors still can be missed. If any questions arise, please do not hesitate to call for verification.  Electronically signed by: Felix Pacinienee Kuneff, DO [facility]

## 2017-12-08 ENCOUNTER — Encounter: Payer: Self-pay | Admitting: Family Medicine

## 2017-12-08 ENCOUNTER — Ambulatory Visit (INDEPENDENT_AMBULATORY_CARE_PROVIDER_SITE_OTHER): Payer: Managed Care, Other (non HMO) | Admitting: Family Medicine

## 2017-12-08 VITALS — BP 112/79 | HR 82 | Temp 97.4°F | Wt 137.0 lb

## 2017-12-08 DIAGNOSIS — N926 Irregular menstruation, unspecified: Secondary | ICD-10-CM | POA: Diagnosis not present

## 2017-12-08 DIAGNOSIS — N912 Amenorrhea, unspecified: Secondary | ICD-10-CM | POA: Insufficient documentation

## 2017-12-08 DIAGNOSIS — R110 Nausea: Secondary | ICD-10-CM

## 2017-12-08 LAB — POCT RAPID STREP A (OFFICE): RAPID STREP A SCREEN: NEGATIVE

## 2017-12-08 LAB — POCT URINE PREGNANCY: Preg Test, Ur: NEGATIVE

## 2017-12-08 NOTE — Progress Notes (Signed)
Yolanda FoundRachel S Wenzlick , Feb 14, 1981, 36 y.o., female MRN: [id number] Patient Care Team    Relationship Specialty Notifications Start End  Natalia LeatherwoodKuneff, Renee A, DO PCP - General Family Medicine  12/08/17   Sherian ReinBovard-Stuckert, Jody, MD Consulting Physician Obstetrics and Gynecology  11/16/17   Chrystie NoseHilty, Kenneth C, MD Consulting Physician Cardiology  11/16/17     Chief Complaint  Patient presents with  . Nausea    pts want to be check for strep but denies sore throat     Subjective: Pt presents for an OV with complaints of nausea of several days duration. She throat she has a stomach bug. She denies any URI symptoms, fever, chills, vomit, rash or diarrhea. Her LMP was 12/1, but very light and only last 4 days, which both are abnormal for her.  Her daughter tested positive for strep throat yesterday. She denies dizziness, headache, breast tenderness, urinary frequency or dysuria. She is not on birth control. They are open to having additional children and not trying to prevent pregnancy if it occurs.   Depression screen PHQ 2/9 11/16/2017  Decreased Interest 0  Down, Depressed, Hopeless 0  PHQ - 2 Score 0    No Known Allergies Social History   Tobacco Use  . Smoking status: Never Smoker  . Smokeless tobacco: Never Used  Substance Use Topics  . Alcohol use: No    Comment: social    Past Medical History:  Diagnosis Date  . Anemia   . Hx of syncope   . IUGR (intrauterine growth restriction) 09/10/2015  . Shortened PR interval 2017   seen cardiology, no meds needed.    Past Surgical History:  Procedure Laterality Date  . tear duct surgery     Family History  Problem Relation Age of Onset  . Hypertension Mother   . [place] Mother   . Valvular heart disease Brother        partly fused "cuspid" valves  . Diabetes Unknown        paternal side of family   Allergies as of 12/08/2017   No Known Allergies     Medication List        Accurate as of 12/08/17  2:49 PM.  Always use your most recent med list.          MULTIVITAMIN ADULT PO Take 1 tablet by mouth daily.       All past medical history, surgical history, allergies, family history, immunizations and medications were updated in the EMR today and reviewed under the history and medication portions of their EMR.     ROS: Negative, with the exception of above mentioned in HPI   Objective:  BP 112/79 (BP Location: Right Arm, Patient Position: Sitting, Cuff Size: Normal)   Pulse 82   Temp (!) 97.4 F (36.3 C)   Wt 137 lb (62.1 kg)   SpO2 97%   BMI 22.11 kg/m  Body mass index is 22.11 kg/m. Gen: Afebrile. No acute distress. Nontoxic in appearance, well developed, well nourished.  HENT: AT. [place]. Bilateral TM visualized without erythema or buldging. MMM, no oral lesions. Bilateral nares without erythema or swelling, no drainage. Throat without erythema or exudates. No cough or  hoarseness.  Eyes:Pupils Equal Round Reactive to light, Extraocular movements intact,  Conjunctiva without redness, discharge or icterus. Neck/lymp/endocrine: Supple,no lymphadenopathy CV: RRR  Chest: CTAB, no wheeze or crackles. Good air movement, normal resp effort.  Abd: Soft. NTND. BS present.  Skin: no rashes, purpura or petechiae.  Neuro:  Normal gait. PERLA. EOMi. Alert. Oriented x3   No exam data present No results found. Results for orders placed or performed in visit on 12/08/17 (from the past 24 hour(s))  POCT rapid strep A     Status: Normal   Collection Time: 12/08/17  2:34 PM  Result Value Ref Range   Rapid Strep A Screen Negative Negative  POCT urine pregnancy     Status: None   Collection Time: 12/08/17  2:47 PM  Result Value Ref Range   Preg Test, Ur Negative Negative    Assessment/Plan: Yolanda Williams is a 36 y.o. female present for OV for  Nausea Irregular menstrual bleeding - POCT rapid strep A--> negative - POCT urine pregnancy--> negative  - Likely viral syndrome. No signs of  infection today on exam. Treat with OTC supportive care. Rest and hydrate.  - F/U PRN   Reviewed expectations re: course of current medical issues.  Discussed self-management of symptoms.  Outlined signs and symptoms indicating need for more acute intervention.  Patient verbalized understanding and all questions were answered.  Patient received an After-Visit Summary.    Orders Placed This Encounter  Procedures  . POCT rapid strep A  . POCT urine pregnancy     Note is dictated utilizing voice recognition software. Although note has been proof read prior to signing, occasional typographical errors still can be missed. If any questions arise, please do not hesitate to call for verification.   electronically signed by:  Felix Pacinienee Kuneff, DO  [facility] [place]

## 2017-12-08 NOTE — Patient Instructions (Signed)
I believe you have a virus. Your strep test and preg test are negative today.    Rest, hydrate (plenty of water). Tylenol if needed.  If not improved in 1-2 weeks or worsening please be seen.

## 2017-12-10 ENCOUNTER — Encounter: Payer: Self-pay | Admitting: *Deleted

## 2017-12-28 NOTE — L&D Delivery Note (Signed)
Delivery Note At 4:14 PM a viable female was delivered via Vaginal, Spontaneous (Presentation: OA  ).  APGAR: 9, 9; weight  pending Placenta status: intact, .  Cord: 3 vessel cord with the following complications: none  Cord pH: none  Anesthesia:  epidural Episiotomy: None Lacerations:  none Suture Repair: no0ne Est. Blood Loss (mL):  400 cc  Mom to postpartum.  Baby to Couplet care / Skin to Skin.  Continue antibiotics postpartum, rocephin and azithromycin, breathing treatments, influenza is negative, for presumed CAP  XRay is pending, appears to me as has increased vascualr marking with no consolidation, sharp costophrenic angles but final report is pending  Yolanda Williams 12/04/2018, 4:34 PM

## 2018-10-31 ENCOUNTER — Ambulatory Visit (INDEPENDENT_AMBULATORY_CARE_PROVIDER_SITE_OTHER): Payer: Managed Care, Other (non HMO) | Admitting: Family Medicine

## 2018-10-31 ENCOUNTER — Encounter: Payer: Self-pay | Admitting: Family Medicine

## 2018-10-31 VITALS — BP 129/75 | HR 89 | Temp 98.3°F | Resp 20 | Ht 66.0 in | Wt 160.0 lb

## 2018-10-31 DIAGNOSIS — R059 Cough, unspecified: Secondary | ICD-10-CM

## 2018-10-31 DIAGNOSIS — R05 Cough: Secondary | ICD-10-CM

## 2018-10-31 NOTE — Patient Instructions (Signed)
You can try a OTC Claritin for allergies. Your exam did not strongly suggest allergies, but you did have some mild swelling/redness in the nose.   Reflux can also cause a cough and may be more of the cause of yours symptoms.    Heartburn Heartburn is a type of pain or discomfort that can happen in the throat or chest. It is often described as a burning pain. It may also cause a bad taste in the mouth. Heartburn may feel worse when you lie down or bend over. It may be caused by stomach contents that move back up (reflux) into the tube that connects the mouth with the stomach (esophagus). Follow these instructions at home: Take these actions to lessen your discomfort and to help avoid problems. Diet  Follow a diet as told by your doctor. You may need to avoid foods and drinks such as: ? Coffee and tea (with or without caffeine). ? Drinks that contain alcohol. ? Energy drinks and sports drinks. ? Carbonated drinks or sodas. ? Chocolate and cocoa. ? Peppermint and mint flavorings. ? Garlic and onions. ? Horseradish. ? Spicy and acidic foods, such as peppers, chili powder, curry powder, vinegar, hot sauces, and BBQ sauce. ? Citrus fruit juices and citrus fruits, such as oranges, lemons, and limes. ? Tomato-based foods, such as red sauce, chili, salsa, and pizza with red sauce. ? Fried and fatty foods, such as donuts, french fries, potato chips, and high-fat dressings. ? High-fat meats, such as hot dogs, rib eye steak, sausage, ham, and bacon. ? High-fat dairy items, such as whole milk, butter, and cream cheese.  Eat small meals often. Avoid eating large meals.  Avoid drinking large amounts of liquid with your meals.  Avoid eating meals during the 2-3 hours before bedtime.  Avoid lying down right after you eat.  Do not exercise right after you eat. General instructions  Pay attention to any changes in your symptoms.  Take over-the-counter and prescription medicines only as told by  your doctor. Do not take aspirin, ibuprofen, or other NSAIDs unless your doctor says it is okay.  Do not use any tobacco products, including cigarettes, chewing tobacco, and e-cigarettes. If you need help quitting, ask your doctor.  Wear loose clothes. Do not wear anything tight around your waist.  Raise (elevate) the head of your bed about 6 inches (15 cm).  Try to lower your stress. If you need help doing this, ask your doctor.  If you are overweight, lose an amount of weight that is healthy for you. Ask your doctor about a safe weight loss goal.  Keep all follow-up visits as told by your doctor. This is important. Contact a doctor if:  You have new symptoms.  You lose weight and you do not know why it is happening.  You have trouble swallowing, or it hurts to swallow.  You have wheezing or a cough that keeps happening.  Your symptoms do not get better with treatment.  You have heartburn often for more than two weeks. Get help right away if:  You have pain in your arms, neck, jaw, teeth, or back.  You feel sweaty, dizzy, or light-headed.  You have chest pain or shortness of breath.  You throw up (vomit) and your throw up looks like blood or coffee grounds.  Your poop (stool) is bloody or black. This information is not intended to replace advice given to you by your health care provider. Make sure you discuss any questions you have  with your health care provider. Document Released: 08/26/2011 Document Revised: 05/21/2016 Document Reviewed: 04/10/2015 Elsevier Interactive Patient Education  2018 [employer].    Food Choices for Gastroesophageal Reflux Disease, Adult When you have gastroesophageal reflux disease (GERD), the foods you eat and your eating habits are very important. Choosing the right foods can help ease your discomfort. What guidelines do I need to follow?  Choose fruits, vegetables, whole grains, and low-fat dairy products.  Choose low-fat meat, fish,  and poultry.  Limit fats such as oils, salad dressings, butter, nuts, and avocado.  Keep a food diary. This helps you identify foods that cause symptoms.  Avoid foods that cause symptoms. These may be different for everyone.  Eat small meals often instead of 3 large meals a day.  Eat your meals slowly, in a place where you are relaxed.  Limit fried foods.  Cook foods using methods other than frying.  Avoid drinking alcohol.  Avoid drinking large amounts of liquids with your meals.  Avoid bending over or lying down until 2-3 hours after eating. What foods are not recommended? These are some foods and drinks that may make your symptoms worse: Vegetables Tomatoes. Tomato juice. Tomato and spaghetti sauce. Chili peppers. Onion and garlic. Horseradish. Fruits Oranges, grapefruit, and lemon (fruit and juice). Meats High-fat meats, fish, and poultry. This includes hot dogs, ribs, ham, sausage, salami, and bacon. Dairy Whole milk and chocolate milk. Sour cream. Cream. Butter. Ice cream. Cream cheese. Drinks Coffee and tea. Bubbly (carbonated) drinks or energy drinks. Condiments Hot sauce. Barbecue sauce. Sweets/Desserts Chocolate and cocoa. Donuts. Peppermint and spearmint. Fats and Oils High-fat foods. This includes [place] fries and potato chips. Other Vinegar. Strong spices. This includes black pepper, white pepper, red pepper, cayenne, curry powder, cloves, ginger, and chili powder. The items listed above may not be a complete list of foods and drinks to avoid. Contact your dietitian for more information. This information is not intended to replace advice given to you by your health care provider. Make sure you discuss any questions you have with your health care provider. Document Released: 06/14/2012 Document Revised: 05/21/2016 Document Reviewed: 10/18/2013 Elsevier Interactive Patient Education  2017 [employer].

## 2018-10-31 NOTE — Progress Notes (Signed)
Yolanda Williams , 1981-03-26, 37 y.o., female MRN: [id number] Patient Care Team    Relationship Specialty Notifications Start End  Natalia Leatherwood, DO PCP - General Family Medicine  12/08/17   Sherian Rein, MD Consulting Physician Obstetrics and Gynecology  11/16/17   Chrystie Nose, MD Consulting Physician Cardiology  11/16/17     Chief Complaint  Patient presents with  . Cough    congestion     Subjective: Pt presents for an OV with complaints of cough  of x 2 weeks duration.  Associated symptoms cough that is a tickle cough, Present more at night and first in the morning. Also increased with talking. She denies runny nose, nasal congestion, fever, chills, productive cough or sinus pressure. She does endorse frequent heartburn since she has become pregnant. She takes tums as needed. She has not tried an antihistamine.   Depression screen PHQ 2/9 11/16/2017  Decreased Interest 0  Down, Depressed, Hopeless 0  PHQ - 2 Score 0    No Known Allergies Social History   Tobacco Use  . Smoking status: Never Smoker  . Smokeless tobacco: Never Used  Substance Use Topics  . Alcohol use: No    Comment: social    Past Medical History:  Diagnosis Date  . Anemia   . Hx of syncope   . IUGR (intrauterine growth restriction) 09/10/2015  . Shortened PR interval 2017   seen cardiology, no meds needed.    Past Surgical History:  Procedure Laterality Date  . tear duct surgery     Family History  Problem Relation Age of Onset  . Hypertension Mother   . Miscarriages / [place] Mother   . Valvular heart disease Brother        partly fused "cuspid" valves  . Diabetes Unknown        paternal side of family   Allergies as of 10/31/2018   No Known Allergies     Medication List        Accurate as of 10/31/18  4:16 PM. Always use your most recent med list.          PNV PRENATAL PLUS MULTIVITAMIN 27-1 MG Tabs Take by mouth.       All past medical history,  surgical history, allergies, family history, immunizations andmedications were updated in the EMR today and reviewed under the history and medication portions of their EMR.     ROS: Negative, with the exception of above mentioned in HPI   Objective:  BP 129/75 (BP Location: Left Arm, Patient Position: Sitting, Cuff Size: Normal)   Pulse 89   Temp 98.3 F (36.8 C)   Resp 20   Ht 5\' 6"  (1.676 m)   Wt 160 lb (72.6 kg)   SpO2 96%   BMI 25.82 kg/m  Body mass index is 25.82 kg/m. Gen: Afebrile. No acute distress. Nontoxic in appearance, well developed, well nourished.  HENT: AT. [place]. Bilateral TM visualized WNL. MMM, no oral lesions. Bilateral nares mild swelling and rednes. Throat without erythema or exudates. Cough present. No hoarseness.  Eyes:Pupils Equal Round Reactive to light, Extraocular movements intact,  Conjunctiva without redness, discharge or icterus. Neck/lymp/endocrine: Supple,no lymphadenopathy CV: RRR Chest: CTAB, no wheeze or crackles. Good air movement, normal resp effort.  Abd: Gravid.  Neuro:  Normal gait. PERLA. EOMi. Alert. Oriented x3  Psych: Normal affect, dress and demeanor. Normal speech. Normal thought content and judgment.  No exam data present No results found. No results found for  this or any previous visit (from the past 24 hour(s)).  Assessment/Plan: IRINE HEMINGER is a 37 y.o. female present for OV for  Cough [redacted]w[redacted]d G2P1001. Exam normal, other cough appreciated. Discussed safety of Claritin during pregnancy. She has more frequent heartburn, could be GERD related as well.  Dicussed GERD dietary modifications. Continue Tums. She can discuss the safety of use of Prilosec with her OB if she desires.  No red flags today, does not appear infectious.     Reviewed expectations re: course of current medical issues.  Discussed self-management of symptoms.  Outlined signs and symptoms indicating need for more acute intervention.  Patient verbalized  understanding and all questions were answered.  Patient received an After-Visit Summary.    No orders of the defined types were placed in this encounter.    Note is dictated utilizing voice recognition software. Although note has been proof read prior to signing, occasional typographical errors still can be missed. If any questions arise, please do not hesitate to call for verification.   electronically signed by:  Felix Pacini, DO  [facility] [place]

## 2018-11-23 ENCOUNTER — Encounter: Payer: Self-pay | Admitting: Family Medicine

## 2018-11-23 ENCOUNTER — Ambulatory Visit (INDEPENDENT_AMBULATORY_CARE_PROVIDER_SITE_OTHER): Payer: Managed Care, Other (non HMO) | Admitting: Family Medicine

## 2018-11-23 VITALS — BP 130/82 | HR 98 | Temp 98.6°F | Resp 16 | Wt 162.0 lb

## 2018-11-23 DIAGNOSIS — J209 Acute bronchitis, unspecified: Secondary | ICD-10-CM

## 2018-11-23 DIAGNOSIS — J069 Acute upper respiratory infection, unspecified: Secondary | ICD-10-CM

## 2018-11-23 MED ORDER — AMOXICILLIN 500 MG PO CAPS: ORAL_CAPSULE | ORAL | 0 refills | Status: DC

## 2018-11-23 NOTE — Progress Notes (Signed)
OFFICE VISIT  11/23/2018   CC:  Chief Complaint  Patient presents with  . Cough   HPI:    Patient is a 37 y.o. Caucasian female who presents for cough. Pt is [redacted] weeks pregnant.  Has had cough about 5 wks now. Had been present as more of any upper airway cough for about 5 weeks, GERD-related.  Then she started feeling some PND and "it tasted like I had a cold" and this has been going on for 2 wks or so, then got worse the last day or two with production of yellow mucous, feels more like it is coming from the chest.  Hoarseness and nasal congestion the last 1d. No ST. No fevers.  No wheezing.  No new/worsening of SOB other than what she has gradually felt as her pregnancy has progressed.  No HA or ear pains.  No body aches or sense of worsening fatigue. Has appt with her ob this afternoon.  Husband has pneumonia per her report.   Past Medical History:  Diagnosis Date  . Anemia   . Hx of syncope   . IUGR (intrauterine growth restriction) 09/10/2015  . Shortened PR interval 2017   seen cardiology, no meds needed.     Past Surgical History:  Procedure Laterality Date  . tear duct surgery      Outpatient Medications Prior to Visit  Medication Sig Dispense Refill  . Prenatal Vit-Fe Fumarate-FA (PNV PRENATAL PLUS MULTIVITAMIN) 27-1 MG TABS Take by mouth.     No facility-administered medications prior to visit.     No Known Allergies  ROS As per HPI  PE: Blood pressure 130/82, pulse 98, temperature 98.6 F (37 C), temperature source Temporal, resp. rate 16, weight 162 lb (73.5 kg), SpO2 99 %, unknown if currently breastfeeding. VS: noted--normal. Gen: alert, NAD, NONTOXIC APPEARING. HEENT: eyes without injection, drainage, or swelling.  Ears: EACs clear, TMs with normal light reflex and landmarks.  Nose: Clear rhinorrhea, with some dried, crusty exudate adherent to mildly injected mucosa.  No purulent d/c.  No paranasal sinus TTP.  No facial swelling.  Throat and mouth  without focal lesion.  No pharyngial swelling, erythema, or exudate.   Neck: supple, no LAD.   LUNGS: CTA bilat, nonlabored resps.   CV: RRR, no m/r/g. EXT: no c/c/e SKIN: no rash  LABS:    Chemistry      Component Value Date/Time   NA 139 11/16/2017 1457   K 4.0 11/16/2017 1457   CL 102 11/16/2017 1457   CO2 32 11/16/2017 1457   BUN 12 11/16/2017 1457   CREATININE 0.59 11/16/2017 1457   CREATININE 0.75 01/06/2014 1545      Component Value Date/Time   CALCIUM 9.7 11/16/2017 1457   ALKPHOS 51 01/06/2014 1545   AST 15 01/06/2014 1545   ALT <8 01/06/2014 1545   BILITOT 0.3 01/06/2014 1545      IMPRESSION AND PLAN:  No problem-specific Assessment & Plan notes found for this encounter.  Prolonged URI with cough, worsening the last 24-48h, husband with pneumonia. Amoxil recommended. She'll discuss with her OB this afternoon.  I gave printed rx today for amoxil 1000 mg tid x 7d.  An After Visit Summary was printed and given to the patient.  FOLLOW UP: Return if symptoms worsen or fail to improve.  Signed:  Santiago BumpersPhil Burhan Barham, MD           11/23/2018

## 2018-12-02 ENCOUNTER — Other Ambulatory Visit: Payer: Self-pay

## 2018-12-02 ENCOUNTER — Encounter: Payer: Self-pay | Admitting: Family Medicine

## 2018-12-02 ENCOUNTER — Ambulatory Visit (INDEPENDENT_AMBULATORY_CARE_PROVIDER_SITE_OTHER): Payer: Managed Care, Other (non HMO) | Admitting: Family Medicine

## 2018-12-02 VITALS — BP 122/77 | HR 107 | Temp 97.7°F | Resp 16 | Ht 66.0 in | Wt 162.0 lb

## 2018-12-02 DIAGNOSIS — J0100 Acute maxillary sinusitis, unspecified: Secondary | ICD-10-CM

## 2018-12-02 MED ORDER — AMOXICILLIN-POT CLAVULANATE 875-125 MG PO TABS: 1.0000 | ORAL_TABLET | Freq: Two times a day (BID) | ORAL | 0 refills | Status: DC

## 2018-12-02 NOTE — Progress Notes (Signed)
Yolanda Williams , February 25, 1981, 37 y.o., female MRN: [id number] Patient Care Team    Relationship Specialty Notifications Start End  Natalia LeatherwoodKuneff,  A, DO PCP - General Family Medicine  12/08/17   Sherian ReinBovard-Stuckert, Jody, MD Consulting Physician Obstetrics and Gynecology  11/16/17   Chrystie NoseHilty, Kenneth C, MD Consulting Physician Cardiology  11/16/17     Chief Complaint  Patient presents with  . Cough    X's 6 weeks  . Nasal Congestion    X's 1 week     Subjective: Pt presents for an OV with complaints of continued cough of 4- 6 weeks duration.  G2P1001 @ 6166w6d. Associated symptoms include cough is now productive, right chest is sore from coughing, fever, nasal congestion, headache. She took a tylenol about 1 hour ago for her fever. She has felt her baby move frequently. She is hydrating well. She saw her OB yesterday.  Pt has tried delsym, mucinex DM, sudafed, claritin (approved by her OB) to ease their symptoms. She has not taken sudafed today.   Depression screen PHQ 2/9 11/16/2017  Decreased Interest 0  Down, Depressed, Hopeless 0  PHQ - 2 Score 0    No Known Allergies Social History   Tobacco Use  . Smoking status: Never Smoker  . Smokeless tobacco: Never Used  Substance Use Topics  . Alcohol use: No    Comment: social    Past Medical History:  Diagnosis Date  . Anemia   . Hx of syncope   . IUGR (intrauterine growth restriction) 09/10/2015  . Shortened PR interval 2017   seen cardiology, no meds needed.    Past Surgical History:  Procedure Laterality Date  . tear duct surgery     Family History  Problem Relation Age of Onset  . Hypertension Mother   . [place]isc[place]riages / [place] Mother   . Valvular heart disease Brother        partly fused "cuspid" valves  . Diabetes Unknown        paternal side of family   Allergies as of 12/02/2018   No Known Allergies     Medication List        Accurate as of 12/02/18  3:57 PM. Always use your most recent med list.            PNV PRENATAL PLUS MULTIVITAMIN 27-1 MG Tabs Take by mouth.       All past medical history, surgical history, allergies, family history, immunizations andmedications were updated in the EMR today and reviewed under the history and medication portions of their EMR.     ROS: Negative, with the exception of above mentioned in HPI   Objective:  BP 122/77   Pulse (!) 107   Temp 97.7 F (36.5 C) (Oral)   Resp 16   Ht 5\' 6"  (1.676 m)   Wt 162 lb (73.5 kg)   SpO2 95%   BMI 26.15 kg/m  Body mass index is 26.15 kg/m. Gen: Afebrile. No acute distress. Nontoxic in appearance, well developed, well nourished.  HENT: AT. [place]. Bilateral TM visualized Without erythema or fullness. MMM, no oral lesions. Bilateral nares with erythema, dry, drainage. Throat without erythema or exudates. PND, cough present.  Eyes:Pupils Equal Round Reactive to light, Extraocular movements intact,  Conjunctiva without redness, discharge or icterus. Neck/lymp/endocrine: Supple,no lymphadenopathy CV: tachycardic, no edema Chest: CTAB, no wheeze or crackles. Good air movement, normal resp effort.  Abd: Soft. Gravid. Skin: no rashes, purpura or petechiae.  Neuro:  Normal gait.  PERLA. EOMi. Alert. Oriented x3   No exam data present No results found. No results found for this or any previous visit (from the past 24 hour(s)).  Assessment/Plan: Yolanda Williams is a 37 y.o. female present for OV for  Acute non-recurrent maxillary sinusitis Rest, hydrate. Hydrate. Hydrate. She is mildly tachycardic.  + flonase, mucinex (DM if cough),  Augmentin prescribed, take until completed.  If cough present it can last up to 6-8 weeks.  Monitor for fetal movements and if cramping continues [facility]  F/U 2 weeks of not improved.    Reviewed expectations re: course of current medical issues.  Discussed self-management of symptoms.  Outlined signs and symptoms indicating need for  more acute intervention.  Patient verbalized understanding and all questions were answered.  Patient received an After-Visit Summary.    No orders of the defined types were placed in this encounter.    Note is dictated utilizing voice recognition software. Although note has been proof read prior to signing, occasional typographical errors still can be missed. If any questions arise, please do not hesitate to call for verification.   electronically signed by:  Felix Pacini, DO  [facility] [place]

## 2018-12-02 NOTE — Patient Instructions (Signed)
Rest, hydrate.  + flonase, mucinex (DM if cough),  Augmentin prescribed, take until completed.  If cough present it can last up to 6-8 weeks.  F/U 2 weeks of not improved.    Sinusitis, Adult Sinusitis is soreness and inflammation of your sinuses. Sinuses are hollow spaces in the bones around your face. They are located:  Around your eyes.  In the middle of your forehead.  Behind your nose.  In your cheekbones.  Your sinuses and nasal passages are lined with a stringy fluid (mucus). Mucus normally drains out of your sinuses. When your nasal tissues get inflamed or swollen, the mucus can get trapped or blocked so air cannot flow through your sinuses. This lets bacteria, viruses, and funguses grow, and that leads to infection. Follow these instructions at home: Medicines  Take, use, or apply over-the-counter and prescription medicines only as told by your doctor. These may include nasal sprays.  If you were prescribed an antibiotic medicine, take it as told by your doctor. Do not stop taking the antibiotic even if you start to feel better. Hydrate and Humidify  Drink enough water to keep your pee (urine) clear or pale yellow.  Use a cool mist humidifier to keep the humidity level in your home above 50%.  Breathe in steam for 10-15 minutes, 3-4 times a day or as told by your doctor. You can do this in the bathroom while a hot shower is running.  Try not to spend time in cool or dry air. Rest  Rest as much as possible.  Sleep with your head raised (elevated).  Make sure to get enough sleep each night. General instructions  Put a warm, moist washcloth on your face 3-4 times a day or as told by your doctor. This will help with discomfort.  Wash your hands often with soap and water. If there is no soap and water, use hand sanitizer.  Do not smoke. Avoid being around people who are smoking (secondhand smoke).  Keep all follow-up visits as told by your doctor. This is  important. Contact a doctor if:  You have a fever.  Your symptoms get worse.  Your symptoms do not get better within 10 days. Get help right away if:  You have a very bad headache.  You cannot stop throwing up (vomiting).  You have pain or swelling around your face or eyes.  You have trouble seeing.  You feel confused.  Your neck is stiff.  You have trouble breathing. This information is not intended to replace advice given to you by your health care provider. Make sure you discuss any questions you have with your health care provider. Document Released: 06/01/2008 Document Revised: 08/09/2016 Document Reviewed: 10/09/2015 Elsevier Interactive Patient Education  [employer]2018 Elsevier Inc.

## 2018-12-04 ENCOUNTER — Inpatient Hospital Stay (HOSPITAL_COMMUNITY): Payer: Managed Care, Other (non HMO) | Admitting: Anesthesiology

## 2018-12-04 ENCOUNTER — Encounter (HOSPITAL_COMMUNITY): Payer: Self-pay | Admitting: *Deleted

## 2018-12-04 ENCOUNTER — Inpatient Hospital Stay (HOSPITAL_COMMUNITY): Payer: Managed Care, Other (non HMO)

## 2018-12-04 ENCOUNTER — Inpatient Hospital Stay (HOSPITAL_COMMUNITY)
Admission: AD | Admit: 2018-12-04 | Discharge: 2018-12-06 | DRG: 805 | Disposition: A | Discharge status: Home / Self Care | Payer: Managed Care, Other (non HMO) | Attending: Family Medicine | Admitting: Family Medicine

## 2018-12-04 DIAGNOSIS — Z3A38 38 weeks gestation of pregnancy: Secondary | ICD-10-CM

## 2018-12-04 DIAGNOSIS — O9952 Diseases of the respiratory system complicating childbirth: Secondary | ICD-10-CM | POA: Diagnosis present

## 2018-12-04 DIAGNOSIS — J189 Pneumonia, unspecified organism: Secondary | ICD-10-CM | POA: Diagnosis present

## 2018-12-04 DIAGNOSIS — Z3483 Encounter for supervision of other normal pregnancy, third trimester: Secondary | ICD-10-CM | POA: Diagnosis present

## 2018-12-04 LAB — URINALYSIS, ROUTINE W REFLEX MICROSCOPIC
Bilirubin Urine: NEGATIVE
GLUCOSE, UA: NEGATIVE mg/dL
Ketones, ur: NEGATIVE mg/dL
Nitrite: NEGATIVE
Protein, ur: NEGATIVE mg/dL
Specific Gravity, Urine: 1.003 — ABNORMAL LOW (ref 1.005–1.030)
pH: 7 (ref 5.0–8.0)

## 2018-12-04 LAB — CBC
HCT: 33.2 % — ABNORMAL LOW (ref 36.0–46.0)
Hemoglobin: 10.9 g/dL — ABNORMAL LOW (ref 12.0–15.0)
MCH: 31.7 pg (ref 26.0–34.0)
MCHC: 32.8 g/dL (ref 30.0–36.0)
MCV: 96.5 fL (ref 80.0–100.0)
Platelets: 224 10*3/uL (ref 150–400)
RBC: 3.44 MIL/uL — ABNORMAL LOW (ref 3.87–5.11)
RDW: 13.5 % (ref 11.5–15.5)
WBC: 13.7 10*3/uL — ABNORMAL HIGH (ref 4.0–10.5)
nRBC: 0 % (ref 0.0–0.2)

## 2018-12-04 LAB — TYPE AND SCREEN
ABO/RH(D): B POS
Antibody Screen: NEGATIVE

## 2018-12-04 LAB — INFLUENZA PANEL BY PCR (TYPE A & B)
INFLBPCR: NEGATIVE
Influenza A By PCR: NEGATIVE

## 2018-12-04 MED ORDER — OXYTOCIN 40 UNITS IN LACTATED RINGERS INFUSION - SIMPLE MED
2.5000 [IU]/h | INTRAVENOUS | Status: DC
  Filled 2018-12-04: qty 1000

## 2018-12-04 MED ORDER — PHENYLEPHRINE 40 MCG/ML (10ML) SYRINGE FOR IV PUSH (FOR BLOOD PRESSURE SUPPORT)
80.0000 ug | PREFILLED_SYRINGE | INTRAVENOUS | Status: DC | PRN
  Filled 2018-12-04 (×2): qty 10

## 2018-12-04 MED ORDER — DEXTROSE 5 % IV SOLN
750.0000 mg | Freq: Once | INTRAVENOUS | Status: AC
  Administered 2018-12-04: 750 mg via INTRAVENOUS
  Filled 2018-12-04: qty 750

## 2018-12-04 MED ORDER — ONDANSETRON HCL 4 MG/2ML IJ SOLN: 4.0000 mg | INTRAMUSCULAR | Status: DC | PRN

## 2018-12-04 MED ORDER — EPHEDRINE 5 MG/ML INJ
10.0000 mg | INTRAVENOUS | Status: DC | PRN
  Filled 2018-12-04: qty 2

## 2018-12-04 MED ORDER — ONDANSETRON HCL 4 MG PO TABS: 4.0000 mg | ORAL_TABLET | ORAL | Status: DC | PRN

## 2018-12-04 MED ORDER — DIPHENHYDRAMINE HCL 25 MG PO CAPS: 25.0000 mg | ORAL_CAPSULE | Freq: Four times a day (QID) | ORAL | Status: DC | PRN

## 2018-12-04 MED ORDER — BENZOCAINE-MENTHOL 20-0.5 % EX AERO: 1.0000 "application " | INHALATION_SPRAY | CUTANEOUS | Status: DC | PRN

## 2018-12-04 MED ORDER — ALBUTEROL SULFATE (2.5 MG/3ML) 0.083% IN NEBU
2.5000 mg | INHALATION_SOLUTION | Freq: Four times a day (QID) | RESPIRATORY_TRACT | Status: DC
  Administered 2018-12-04: 2.5 mg via RESPIRATORY_TRACT
  Filled 2018-12-04: qty 3

## 2018-12-04 MED ORDER — PRENATAL MULTIVITAMIN CH
1.0000 | ORAL_TABLET | Freq: Every day | ORAL | Status: DC
  Administered 2018-12-05 – 2018-12-06 (×2): 1 via ORAL
  Filled 2018-12-04 (×2): qty 1

## 2018-12-04 MED ORDER — SENNOSIDES-DOCUSATE SODIUM 8.6-50 MG PO TABS
2.0000 | ORAL_TABLET | ORAL | Status: DC
  Administered 2018-12-05 (×2): 2 via ORAL
  Filled 2018-12-04 (×2): qty 2

## 2018-12-04 MED ORDER — LACTATED RINGERS IV SOLN: 500.0000 mL | INTRAVENOUS | Status: DC | PRN

## 2018-12-04 MED ORDER — METHYLERGONOVINE MALEATE 0.2 MG PO TABS
0.2000 mg | ORAL_TABLET | ORAL | Status: AC
  Administered 2018-12-04 – 2018-12-05 (×6): 0.2 mg via ORAL
  Filled 2018-12-04 (×6): qty 1

## 2018-12-04 MED ORDER — IPRATROPIUM-ALBUTEROL 0.5-2.5 (3) MG/3ML IN SOLN
3.0000 mL | RESPIRATORY_TRACT | Status: DC
  Administered 2018-12-04 – 2018-12-06 (×9): 3 mL via RESPIRATORY_TRACT
  Filled 2018-12-04 (×20): qty 3

## 2018-12-04 MED ORDER — SOD CITRATE-CITRIC ACID 500-334 MG/5ML PO SOLN: 30.0000 mL | ORAL | Status: DC | PRN

## 2018-12-04 MED ORDER — DIBUCAINE 1 % RE OINT: 1.0000 "application " | TOPICAL_OINTMENT | RECTAL | Status: DC | PRN

## 2018-12-04 MED ORDER — ACETAMINOPHEN 325 MG PO TABS: 650.0000 mg | ORAL_TABLET | ORAL | Status: DC | PRN

## 2018-12-04 MED ORDER — LACTATED RINGERS IV SOLN
INTRAVENOUS | Status: DC
  Administered 2018-12-04: 12:00:00 via INTRAVENOUS
  Administered 2018-12-04: 125 mL/h via INTRAVENOUS

## 2018-12-04 MED ORDER — HYDROCOD POLST-CPM POLST ER 10-8 MG/5ML PO SUER
5.0000 mL | Freq: Two times a day (BID) | ORAL | Status: DC | PRN
  Administered 2018-12-04 – 2018-12-06 (×3): 5 mL via ORAL
  Filled 2018-12-04 (×4): qty 5

## 2018-12-04 MED ORDER — SIMETHICONE 80 MG PO CHEW: 80.0000 mg | CHEWABLE_TABLET | ORAL | Status: DC | PRN

## 2018-12-04 MED ORDER — OXYCODONE-ACETAMINOPHEN 5-325 MG PO TABS: 1.0000 | ORAL_TABLET | ORAL | Status: DC | PRN

## 2018-12-04 MED ORDER — FLEET ENEMA 7-19 GM/118ML RE ENEM: 1.0000 | ENEMA | RECTAL | Status: DC | PRN

## 2018-12-04 MED ORDER — LACTATED RINGERS IV SOLN
500.0000 mL | Freq: Once | INTRAVENOUS | Status: AC
  Administered 2018-12-04: 500 mL via INTRAVENOUS

## 2018-12-04 MED ORDER — SODIUM CHLORIDE 0.9 % IV SOLN
2.0000 g | INTRAVENOUS | Status: DC
  Administered 2018-12-04 – 2018-12-05 (×2): 2 g via INTRAVENOUS
  Filled 2018-12-04 (×2): qty 20

## 2018-12-04 MED ORDER — SODIUM CHLORIDE 0.9 % IV SOLN
500.0000 mg | INTRAVENOUS | Status: DC
  Administered 2018-12-05: 500 mg via INTRAVENOUS
  Filled 2018-12-04: qty 500

## 2018-12-04 MED ORDER — WITCH HAZEL-GLYCERIN EX PADS: 1.0000 "application " | MEDICATED_PAD | CUTANEOUS | Status: DC | PRN

## 2018-12-04 MED ORDER — FENTANYL CITRATE (PF) 100 MCG/2ML IJ SOLN
100.0000 ug | Freq: Once | INTRAMUSCULAR | Status: AC
  Administered 2018-12-04: 100 ug via INTRAVENOUS
  Filled 2018-12-04: qty 2

## 2018-12-04 MED ORDER — ZOLPIDEM TARTRATE 5 MG PO TABS: 5.0000 mg | ORAL_TABLET | Freq: Every evening | ORAL | Status: DC | PRN

## 2018-12-04 MED ORDER — ONDANSETRON HCL 4 MG/2ML IJ SOLN
4.0000 mg | Freq: Four times a day (QID) | INTRAMUSCULAR | Status: DC | PRN
  Administered 2018-12-04: 4 mg via INTRAVENOUS
  Filled 2018-12-04: qty 2

## 2018-12-04 MED ORDER — TETANUS-DIPHTH-ACELL PERTUSSIS 5-2.5-18.5 LF-MCG/0.5 IM SUSP: 0.5000 mL | Freq: Once | INTRAMUSCULAR | Status: DC

## 2018-12-04 MED ORDER — DIPHENHYDRAMINE HCL 50 MG/ML IJ SOLN: 12.5000 mg | INTRAMUSCULAR | Status: DC | PRN

## 2018-12-04 MED ORDER — OXYCODONE-ACETAMINOPHEN 5-325 MG PO TABS: 2.0000 | ORAL_TABLET | ORAL | Status: DC | PRN

## 2018-12-04 MED ORDER — OXYTOCIN BOLUS FROM INFUSION
500.0000 mL | Freq: Once | INTRAVENOUS | Status: AC
  Administered 2018-12-04: 500 mL via INTRAVENOUS

## 2018-12-04 MED ORDER — LIDOCAINE HCL (PF) 1 % IJ SOLN
30.0000 mL | INTRAMUSCULAR | Status: DC | PRN
  Filled 2018-12-04: qty 30

## 2018-12-04 MED ORDER — COCONUT OIL OIL
1.0000 "application " | TOPICAL_OIL | Status: DC | PRN
  Administered 2018-12-06: 1 via TOPICAL
  Filled 2018-12-04: qty 120

## 2018-12-04 MED ORDER — METHYLERGONOVINE MALEATE 0.2 MG/ML IJ SOLN
INTRAMUSCULAR | Status: AC
  Administered 2018-12-04: 0.2 mg
  Filled 2018-12-04: qty 1

## 2018-12-04 MED ORDER — IBUPROFEN 600 MG PO TABS
600.0000 mg | ORAL_TABLET | Freq: Four times a day (QID) | ORAL | Status: DC
  Administered 2018-12-04 – 2018-12-06 (×7): 600 mg via ORAL
  Filled 2018-12-04 (×7): qty 1

## 2018-12-04 MED ORDER — PHENYLEPHRINE 40 MCG/ML (10ML) SYRINGE FOR IV PUSH (FOR BLOOD PRESSURE SUPPORT)
80.0000 ug | PREFILLED_SYRINGE | INTRAVENOUS | Status: DC | PRN
  Filled 2018-12-04: qty 10

## 2018-12-04 MED ORDER — FENTANYL 2.5 MCG/ML BUPIVACAINE 1/10 % EPIDURAL INFUSION (WH - ANES)
14.0000 mL/h | INTRAMUSCULAR | Status: DC | PRN
  Administered 2018-12-04: 14 mL/h via EPIDURAL
  Filled 2018-12-04: qty 100

## 2018-12-04 MED ORDER — LIDOCAINE HCL (PF) 1 % IJ SOLN
INTRAMUSCULAR | Status: DC | PRN
  Administered 2018-12-04: 13 mL via EPIDURAL

## 2018-12-04 NOTE — Progress Notes (Signed)
Yolanda Williams is a 37 y.o. G2P1001 at 5641w1d by  admitted for active labor  Subjective:  Faculty practice is now assuming care after hospital admission after Dr Shea StakesBanda discussed with patient and her husband   Objective: BP 105/60   Pulse (!) 102   Temp 97.7 F (36.5 C) (Axillary)   Resp 18   Ht 5\' 6"  (1.676 m)   Wt 73.5 kg   SpO2 96%   BMI 26.15 kg/m  No intake/output data recorded. No intake/output data recorded.  Lungs course wet breath sounds throughout all lung fields with end expiratory wheezes noted, diminished breath sounds FHT:  FHR: 135 bpm, variability: moderate,  accelerations:  Present,  decelerations:  Absent UC:   regular, every 5-6 minutes SVE:   Dilation: 5 Effacement (%): 90 Station: -1 Exam by:: Quinnley Colasurdo.MD  Labs: Lab Results  Component Value Date   WBC 13.7 (H) 12/04/2018   HGB 10.9 (L) 12/04/2018   HCT 33.2 (L) 12/04/2018   MCV 96.5 12/04/2018   PLT 224 12/04/2018    Assessment / Plan: Spontaneous labor, progressing normally  Possible CAP  Labor: Progressing normally Preeclampsia:   Fetal Wellbeing:  Category I Pain Control:  Epidural I/D:   Anticipated MOD:  NSVD   Will order portable chest Xray to evaluate for pneumonia Rocephin + azithromycin for coverage of CAP Flu swab done Breathing treatment ordered  Lazaro ArmsLuther H Redina Zeller 12/04/2018, 2:20 PM

## 2018-12-04 NOTE — Anesthesia Procedure Notes (Signed)
Epidural Patient location during procedure: OB Start time: 12/04/2018 1:34 PM End time: 12/04/2018 1:48 PM  Staffing Anesthesiologist: Lowella CurbMiller, Michelene Keniston Ray, MD Performed: anesthesiologist   Preanesthetic Checklist Completed: patient identified, site marked, surgical consent, pre-op evaluation, timeout performed, IV checked, risks and benefits discussed and monitors and equipment checked  Epidural Patient position: sitting Prep: ChloraPrep Patient monitoring: heart rate, cardiac monitor, continuous pulse ox and blood pressure Approach: midline Location: L2-L3 Injection technique: LOR saline  Needle:  Needle type: Tuohy  Needle gauge: 17 G Needle length: 9 cm Needle insertion depth: 3 cm Catheter type: closed end flexible Catheter size: 20 Guage Catheter at skin depth: 7 cm Test dose: negative  Assessment Events: blood not aspirated, injection not painful, no injection resistance, negative IV test and no paresthesia  Additional Notes Reason for block:procedure for pain

## 2018-12-04 NOTE — MAU Note (Signed)
Pt states she started having contractions around 0500.  They have gotten closer together and stronger.  She is also having some vaginal bleeding around 0845 when she wipes that is red to pink.  Denies LOF.  H/o partial placenta previa that has resolved.

## 2018-12-04 NOTE — Lactation Note (Signed)
This note was copied from a baby's chart. Lactation Consultation Note  Patient Name: Yolanda Williams Today's Date: 12/04/2018 Reason for consult: Initial assessment;Early term 37-38.6wks P2, 5 hour female infant. Mom feels confident in her BF abilities and doesn't have any concerns at this time. Mom is experienced at  Breastfeeding  she BF her 37 year old for 18 months.  LC did not observe a latch at this time, per mom, she had BF infant for (10 minutes)  one hour [facility] to [facility]. Mom wanted LC review hand expression and mom easily expressed 6 ml of colostrum which she will offer after breastfeeding infant at next feeding. LC discussed I & O. Reviewed Baby & Me book's Breastfeeding Basics.  Mom will BF infant according hunger cues, 8 to 12 times within 24 hours and not exceed 3 hours without BF infant. Mom knows to call Nurse or LC if she has any questions, concerns or need assistance with latching infant at breast.  Mom made aware of O/P services, breastfeeding support groups, community resources, and our phone # for post-discharge questions.  Maternal Data Formula Feeding for Exclusion: No Has patient been taught Hand Expression?: Yes(Mom is knowledgeable about hand expression and expressed 7 ml of colostrum that will be offered at next feeding .) Does the patient have breastfeeding experience prior to this delivery?: Yes  Feeding Feeding Type: Breast Fed  LATCH Score             Interventions Interventions: Breast feeding basics reviewed;Hand express  Lactation Tools Discussed/Used WIC Program: No   Consult Status      Yolanda Williams 12/04/2018, 9:41 PM

## 2018-12-04 NOTE — Plan of Care (Signed)
  Problem: Education: Goal: Knowledge of condition will improve Note:  Admission education, safety and unit protocols reviewed with patient. Maudry Diegosborne, Georganna Maxson Gall[place] GatewayHudspeth    Problem: Activity: Goal: Will verbalize the importance of balancing activity with adequate rest periods Note:  Instructed patient to call for assistance to the bathroom until otherwise instructed. Earl Galasborne, Linda HedgesStefanie PrescottHudspeth

## 2018-12-04 NOTE — H&P (Signed)
Yolanda Williams is a 37 y.o.G2P1001 female presenting at 69 1/[redacted]wks gestation for painful contractions preceeded by intractible coughing. Pt noted to have made cervical change to 4cm ( was 2cm at last check in office). Pt is dated per LMP which was confirmed with a 9 week [place]. She is of advanced maternal age and had a negative panorama and first trimester screen. She had a low lying placenta which was noted to have resolved on [place] at 32 weeks. She is GBS negative Pt reports cough and congestion for past 5 weeks. Has tried claritin, benadryl, reflux medication, antibiotics - most recently augmentin - humidifier with no relief. She has been afebrile and denies sore throat. She received the flu vaccine and Tdap OB History    Gravida  2   Para  1   Term  1   Preterm      AB      Living  1     SAB      TAB      Ectopic      Multiple  0   Live Births  1          Past Medical History:  Diagnosis Date  . Anemia   . Hx of syncope   . IUGR (intrauterine growth restriction) 09/10/2015  . Shortened PR interval 2017   seen cardiology, no meds needed.    Past Surgical History:  Procedure Laterality Date  . tear duct surgery     Family History: family history includes Diabetes in her unknown relative; Hypertension in her mother; Miscarriages / Stillbirths in her mother; Valvular heart disease in her brother. Social History:  reports that she has never smoked. She has never used smokeless tobacco. She reports that she does not drink alcohol or use drugs.     Maternal Diabetes: No Genetic Screening: Normal Maternal Ultrasounds/Referrals: Normal Fetal Ultrasounds or other Referrals:  None Maternal Substance Abuse:  No Significant Maternal Medications:  Meds include: Other: see HPI Significant Maternal Lab Results:  Lab values include: Group B Strep negative Other Comments:  None  ROS History Dilation: 10 Effacement (%): 100 Station: Plus 2 Exam by:: Yolanda Hall, RN Blood  pressure 118/89, pulse (!) 107, temperature 98.2 F (36.8 C), temperature source Oral, resp. rate 18, height 5\' 6"  (1.676 m), weight 73.5 kg, SpO2 96 %, unknown if currently breastfeeding. Exam Physical Exam  Prenatal labs: ABO, Rh: --/--/B POS (12/08 1217) Antibody: NEG (12/08 1217) Rubella:   RPR:    HBsAg:    HIV:    GBS:     Assessment/Plan: 37yo G2P1001 at 63 1/[redacted]wks gestation in active labor with persistent cough  - Admit for labor management - Cat 1 strip - May have epidural  Due to an earlier contentious interaction with Lebo' husband Yolanda Williams, and continued expressed reservations on his part, I advised pt that she could elect to be cared for during her delivery, and while I am on call, by Yolanda Williams with faculty practice. I offered to be available if she so requested at any time.  Pt expressed interest in this if she would not have any more financial burden as has already paid for delivery and I advised Yolanda Williams would refund her for the delivery aspect of her care if she chose to have Yolanda Williams provide care instead. Pt agreed to this hence care transferred at this time. I also spoke to Yolanda Williams, explained the uncomfortable situation and he very kindly agreed to assume  care for which I am grateful  Yolanda Williams 12/04/2018, 4:17 PM

## 2018-12-04 NOTE — Anesthesia Pain Management Evaluation Note (Signed)
  CRNA Pain Management Visit Note  Patient: Yolanda Williams, 37 y.o., female  "Hello I am a member of the anesthesia team at [facility]. We have an anesthesia team available at all times to provide care throughout the hospital, including epidural management and anesthesia for C-section. I don't know your plan for the delivery whether it a natural birth, water birth, IV sedation, nitrous supplementation, doula or epidural, but we want to meet your pain goals."   1.Was your pain managed to your expectations on prior hospitalizations?   Yes   2.What is your expectation for pain management during this hospitalization?     Epidural  3.How can we help you reach that goal?   Record the patient's initial score and the patient's pain goal.   Pain: 8  Pain Goal: 3 [facility] wants you to be able to say your pain was always managed very well.  Laban EmperorMalinova,Demontray Franta Hristova 12/04/2018

## 2018-12-04 NOTE — Anesthesia Preprocedure Evaluation (Signed)
Anesthesia Evaluation  Patient identified by MRN, date of birth, ID band Patient awake    Reviewed: Allergy & Precautions, H&P , Patient's Chart, lab work & pertinent test results  Airway Mallampati: II  TM Distance: >3 FB Neck ROM: full    Dental no notable dental hx.    Pulmonary neg pulmonary ROS,    Pulmonary exam normal breath sounds clear to auscultation       Cardiovascular negative cardio ROS Normal cardiovascular exam Rhythm:regular Rate:Normal     Neuro/Psych negative neurological ROS  negative psych ROS   GI/Hepatic negative GI ROS, Neg liver ROS,   Endo/Other  negative endocrine ROS  Renal/GU negative Renal ROS  negative genitourinary   Musculoskeletal   Abdominal   Peds  Hematology negative hematology ROS (+)   Anesthesia Other Findings Pregnancy - uncomplicated Platelets and allergies reviewed Denies active cardiac or pulmonary symptoms, METS > 4  Denies blood thinning medications, bleeding disorders, hypertension, asthma, supine hypotension syndrome, previous anesthesia difficulties     Reproductive/Obstetrics (+) Pregnancy                             Anesthesia Physical  Anesthesia Plan  ASA: II  Anesthesia Plan: Epidural   Post-op Pain Management:    Induction:   PONV Risk Score and Plan:   Airway Management Planned:   Additional Equipment:   Intra-op Plan:   Post-operative Plan:   Informed Consent: I have reviewed the patients History and Physical, chart, labs and discussed the procedure including the risks, benefits and alternatives for the proposed anesthesia with the patient or authorized representative who has indicated his/her understanding and acceptance.     Plan Discussed with:   Anesthesia Plan Comments:         Anesthesia Quick Evaluation

## 2018-12-05 LAB — RPR: RPR Ser Ql: NONREACTIVE

## 2018-12-05 LAB — CBC
HCT: 31.5 % — ABNORMAL LOW (ref 36.0–46.0)
Hemoglobin: 10.4 g/dL — ABNORMAL LOW (ref 12.0–15.0)
MCH: 31.8 pg (ref 26.0–34.0)
MCHC: 33 g/dL (ref 30.0–36.0)
MCV: 96.3 fL (ref 80.0–100.0)
PLATELETS: 183 10*3/uL (ref 150–400)
RBC: 3.27 MIL/uL — ABNORMAL LOW (ref 3.87–5.11)
RDW: 13.4 % (ref 11.5–15.5)
WBC: 14.7 10*3/uL — ABNORMAL HIGH (ref 4.0–10.5)
nRBC: 0 % (ref 0.0–0.2)

## 2018-12-05 MED ORDER — IPRATROPIUM BROMIDE 0.02 % IN SOLN
0.5000 mg | Freq: Once | RESPIRATORY_TRACT | Status: DC
  Filled 2018-12-05: qty 2.5

## 2018-12-05 MED ORDER — ALBUTEROL SULFATE (2.5 MG/3ML) 0.083% IN NEBU: 2.5000 mg | INHALATION_SOLUTION | Freq: Once | RESPIRATORY_TRACT | Status: DC

## 2018-12-05 NOTE — Progress Notes (Signed)
MD notified of possible IV infiltration post Atbx therapy. Afebrile throughout this hospital stay. MD ok with pulling IV out and starting PO atbx.

## 2018-12-05 NOTE — Progress Notes (Signed)
Post Partum Day 1 + CAP Subjective: no complaints, up ad lib, voiding and tolerating PO  Still coughing but not as bad, reverse precautions in place  Objective: Blood pressure 112/77, pulse 79, temperature 97.6 F (36.4 C), temperature source Oral, resp. rate 18, height 5\' 6"  (1.676 m), weight 73.5 kg, SpO2 96 %, unknown if currently breastfeeding.  Physical Exam:  General: alert, cooperative and no distress Lochia: appropriate Uterine Fundus: firm Incision:  DVT Evaluation: No evidence of DVT seen on physical exam.  Recent Labs    12/04/18 1217 12/05/18 0541  HGB 10.9* 10.4*  HCT 33.2* 31.5*    Assessment/Plan: Plan for discharge tomorrow and Breastfeeding Discharge to continue her augmentin at home + to complete a course of zithromax as well Continue breathing treatments in house  [facility] to see how her CAP is responding, should get better just by being delivered at this point  LOS: 1 day   Lazaro ArmsLuther H Eure 12/05/2018, 8:57 AM

## 2018-12-05 NOTE — Lactation Note (Signed)
This note was copied from a baby's chart. Lactation Consultation Note  Patient Name: Yolanda Williams PoliceRachel Harm VHQIO'NToday's Date: 12/05/2018 Reason for consult: Follow-up assessment;Early term 1737-38.6wks  Mom reports that baby is breastfeeding well.  Good results with hand expression.  Instructed to feed with cues and call for assist prn.  Mom denies questions or concernsl  Maternal Data    Feeding    LATCH Score                   Interventions    Lactation Tools Discussed/Used     Consult Status Consult Status: Follow-up Date: 12/06/18 Follow-up type: In-patient    Huston FoleyMOULDEN, Maclin Guerrette S 12/05/2018, 2:06 PM

## 2018-12-05 NOTE — Anesthesia Postprocedure Evaluation (Signed)
Anesthesia Post Note  Patient: Yolanda Williams  Procedure(s) Performed: AN AD HOC LABOR EPIDURAL     Patient location during evaluation: Mother Baby Anesthesia Type: Epidural Level of consciousness: awake and alert Pain management: pain level controlled Vital Signs Assessment: post-procedure vital signs reviewed and stable Respiratory status: spontaneous breathing, nonlabored ventilation and respiratory function stable Cardiovascular status: stable Postop Assessment: no headache, no backache, epidural receding, no apparent nausea or vomiting, patient able to bend at knees, adequate PO intake and able to ambulate Anesthetic complications: no    Last Vitals:  Vitals:   12/05/18 0600 12/05/18 0614  BP:  112/77  Pulse:  79  Resp:  18  Temp:  36.4 C  SpO2: 94% 96%    Last Pain:  Vitals:   12/05/18 0614  TempSrc: Oral  PainSc: 0-No pain   Pain Goal:                 Land O'LakesMalinova,Jaquil Todt Hristova

## 2018-12-06 ENCOUNTER — Other Ambulatory Visit: Payer: Self-pay

## 2018-12-06 DIAGNOSIS — J189 Pneumonia, unspecified organism: Secondary | ICD-10-CM

## 2018-12-06 HISTORY — DX: Pneumonia, unspecified organism: J18.9

## 2018-12-06 MED ORDER — IPRATROPIUM BROMIDE 0.06 % NA SOLN: 2.0000 | Freq: Two times a day (BID) | NASAL | 0 refills | Status: DC | PRN

## 2018-12-06 MED ORDER — CEFDINIR 300 MG PO CAPS: 300.0000 mg | ORAL_CAPSULE | Freq: Two times a day (BID) | ORAL | Status: DC

## 2018-12-06 MED ORDER — HYDROCOD POLST-CPM POLST ER 10-8 MG/5ML PO SUER: 5.0000 mL | Freq: Two times a day (BID) | ORAL | 0 refills | Status: DC | PRN

## 2018-12-06 MED ORDER — CEFDINIR 300 MG PO CAPS: 300.0000 mg | ORAL_CAPSULE | Freq: Two times a day (BID) | ORAL | 0 refills | Status: AC

## 2018-12-06 MED ORDER — AZITHROMYCIN 500 MG PO TABS
500.0000 mg | ORAL_TABLET | Freq: Once | ORAL | Status: DC
  Filled 2018-12-06: qty 1

## 2018-12-06 MED ORDER — IBUPROFEN 600 MG PO TABS: 600.0000 mg | ORAL_TABLET | Freq: Four times a day (QID) | ORAL | 0 refills | Status: DC

## 2018-12-06 NOTE — Discharge Summary (Signed)
Obstetrics Discharge Summary OB/GYN Faculty Practice   Patient Name: Yolanda Williams DOB: 1981/10/07 MRN: [id number]  Date of admission: 12/04/2018 Delivering MD: Lazaro ArmsEURE, LUTHER H   Date of discharge: 12/06/2018  Admitting diagnosis: 38 WKS CTX BLEEDING Intrauterine pregnancy: 6026w1d     Secondary diagnosis:   Active Problems:   Indication for care in labor or delivery   CAP (community acquired pneumonia)     Discharge diagnosis: Term Pregnancy Delivered                                            Postpartum procedures: None  Complications: none  Outpatient Follow-Up: [ ]  f/u symptoms of cough, PNA   Hospital course: Yolanda Williams is a 37 y.o. 4026w1d who was admitted for spontaneous onset of labor. Her pregnancy was complicated by recent diagnosis of pneumonia in setting of cough and congestion for 5 weeks. Her labor course was notable for epidural placement, CXR diagnosing pneumonia and initiation of ceftriaxone and azithromycin. Delivery was uncomplicated. Please see delivery/op note for additional details. Her postpartum course was uncomplicated. She was breastfeeding without difficulty. By day of discharge, she was passing flatus, urinating, eating and drinking without difficulty. Her pain was well-controlled, and she was discharged home with ibuprofen. She completed a 3-day course of azithromycin and will be discharged home to complete a course of cefdinir. She will follow-up in clinic in 4 weeks for a postpartum visit and in the next 1-2 weeks for her pneumonia.   Physical exam  Vitals:   12/05/18 1458 12/05/18 2106 12/05/18 2203 12/06/18 0520  BP: 115/84  105/63 99/73  Pulse: 90  91 67  Resp: 16  18 15   Temp: 98 F (36.7 C)  97.8 F (36.6 C)   TempSrc: Oral  Oral   SpO2:  97%  99%  Weight:      Height:       General: well-appearing, NAD Lochia: appropriate Uterine Fundus: firm Incision: N/A DVT Evaluation: No significant calf/ankle edema. Labs: Lab Results  Component  Value Date   WBC 14.7 (H) 12/05/2018   HGB 10.4 (L) 12/05/2018   HCT 31.5 (L) 12/05/2018   MCV 96.3 12/05/2018   PLT 183 12/05/2018   CMP Latest Ref Rng & Units 11/16/2017  Glucose 70 - 99 mg/dL 90  BUN 6 - 23 mg/dL 12  Creatinine 4.090.40 - 8.111.20 mg/dL 9.140.59  Sodium 782135 - 956145 mEq/L 139  Potassium 3.5 - 5.1 mEq/L 4.0  Chloride 96 - 112 mEq/L 102  CO2 19 - 32 mEq/L 32  Calcium 8.4 - 10.5 mg/dL 9.7  Total Protein 6.0 - 8.3 g/dL -  Total Bilirubin 0.3 - 1.2 mg/dL -  Alkaline Phos 39 - 213117 U/L -  AST 0 - 37 U/L -  ALT 0 - 35 U/L -    Discharge instructions: Per After Visit Summary and "Baby and Me Booklet"  After visit meds:  Allergies as of 12/06/2018   No Known Allergies     Medication List    TAKE these medications   acetaminophen 325 MG tablet Commonly known as:  TYLENOL Take 325 mg by mouth every 6 (six) hours as needed for mild pain or fever.   amoxicillin-clavulanate 875-125 MG tablet Commonly known as:  AUGMENTIN Take 1 tablet by mouth 2 (two) times daily.   cefdinir 300 MG capsule Commonly known as:  OMNICEF  Take 1 capsule (300 mg total) by mouth every 12 (twelve) hours for 5 doses.   ibuprofen 600 MG tablet Commonly known as:  ADVIL,MOTRIN Take 1 tablet (600 mg total) by mouth every 6 (six) hours.   ipratropium 0.06 % nasal spray Commonly known as:  ATROVENT Place 2 sprays into both nostrils 2 (two) times daily as needed for rhinitis. To help with cough   PNV PRENATAL PLUS MULTIVITAMIN 27-1 MG Tabs Take 1 tablet by mouth daily.       Postpartum contraception: undecided Diet: Routine Diet Activity: Advance as tolerated. Pelvic rest for 6 weeks.   Follow-up Appt: Patient to call [facility] for [facility] appointment  Newborn Data: Live born female  Birth Weight: 6 lb 12.3 oz (3070 g) APGAR: 9, 9  Newborn Delivery   Birth date/time:  12/04/2018 16:14:00 Delivery type:  Vaginal, Spontaneous     Baby Feeding: Breast Disposition:home with  mother  Cristal Deer. Earlene Plater, DO OB/GYN Fellow, Faculty Practice

## 2018-12-06 NOTE — Lactation Note (Signed)
This note was copied from a baby's chart. Lactation Consultation Note  Patient Name: Girl Stephani PoliceRachel Havens ZOXWR'UToday's Date: 12/06/2018 Reason for consult: Follow-up assessment Feedings going well.  Mom states baby gets sleepy easily at breast.  Instructed to use good breast massage and waking techniques to keep baby engaged.  Discussed milk coming to volume and the prevention and treatment of engorgement.  Lactation outpatient services and support reviewed and encouraged prn.  Maternal Data    Feeding Feeding Type: Breast Fed  LATCH Score                   Interventions    Lactation Tools Discussed/Used     Consult Status Consult Status: Complete Follow-up type: Call as needed    Huston FoleyMOULDEN, Kealani Leckey S 12/06/2018, 10:27 AM

## 2018-12-06 NOTE — Lactation Note (Signed)
This note was copied from a baby's chart. Lactation Consultation Note: Infant is 5143 hours old and is at 7% weight loss.   Arrived in mothers room. Mother has infant latched to the Rt breast in cradle hold.  Mother not using support with pillows or breast support.  Mother reports that she thinks infant is just using her as a pacifier.  Observed infant on the tip of the nipple with pursed shallow lips.  Mothers nipple pinched and pink when infant released the breast.   Assist mother with good pillow support and advised mother to latch infant using good latch technique.  Infant placed in football hold on the left breast. Observed good strong rhythmic suckling with good depth.  Infant sustained latch for 15-20 mins. Mother taught to do breast compression.  Assist mother with hand expression. Infant was spoon fed 2 ml of ebm . Mother taught hand expression.  She was given a curved tip syringe to use with instructions.  Staff nurse to provide mother with a hand pump .  Infant to be weighted again this afternoon.  Discussed treatment and prevention of engorgement.  Advised mother to continue to cue base feed infant and at least 8-12 times in 24 hours or more. Advised mother to use good breast support to prevent infant from nipple feeding.  Mother informed of infants need to latch deep to have good milk transfer.   [facility] . Informed of [facility] and OP dept.    Patient Name: Yolanda Williams [id number]'BToday's Date: 12/06/2018 Reason for consult: Follow-up assessment   Maternal Data    Feeding Feeding Type: Breast Fed  LATCH Score Latch: Grasps breast easily, tongue down, lips flanged, rhythmical sucking.  Audible Swallowing: Spontaneous and intermittent  Type of Nipple: Everted at rest and after stimulation(observed good milk transfer)  Comfort (Breast/Nipple): Filling, red/small blisters or bruises, mild/mod discomfort  Hold (Positioning):  Assistance needed to correctly position infant at breast and maintain latch.  LATCH Score: 8  Interventions    Lactation Tools Discussed/Used     Consult Status Consult Status: Follow-up Date: 12/07/18 Follow-up type: In-patient    Stevan BornKendrick, Yolanda Williams 12/06/2018, 12:31 PM

## 2018-12-07 ENCOUNTER — Ambulatory Visit: Payer: Self-pay

## 2018-12-07 NOTE — Lactation Note (Addendum)
This note was copied from a baby's chart. Lactation Consultation Note:  Mother paged for assistance with finger feeding infant.  Infant very sleepy. Infant was given 5 ml of ebm with a curved tip syringe.  Attempt to latch infant and use #5 french feeding tube. Mother also was instructed in use of the SNS as choice.  Several attempts and unable to get infant awake and roused . No cuing signs.   Father attempt to rouse infant. Infant has a large stool.  Mother very tired and teary.   Discussed the use of a slow flow bottle nipple to give infant extra calories .  Mother has 25 ml of ebm pumped at the bedside with slow flow nipple.  Plan to supplement infant with approx 30 ml ebm per feeding.  Encouraged to continue to breastfeed infant and may use supplemental method of choice.  Mother to continue to post pump after each feeding.   Mother to page staff nurse of [facility] as needed.   Patient Name: Yolanda Stephani PoliceRachel Ghan [id number]'QToday's Date: 12/07/2018 Reason for consult: Follow-up assessment   Maternal Data Has patient been taught Hand Expression?: Yes  Feeding Feeding Type: Breast Fed  LATCH Score                   Interventions Interventions: Hand express;DEBP  Lactation Tools Discussed/Used     Consult Status Consult Status: Follow-up Date: 12/08/18 Follow-up type: In-patient    Stevan BornKendrick, Leo Weyandt [facility] 12/07/2018, 12:21 PM

## 2018-12-07 NOTE — Lactation Note (Addendum)
This note was copied from a baby's chart. Lactation Consultation Note  Patient Name: Yolanda Williams ZOXWR'UToday's Date: 12/07/2018 Reason for consult: Follow-up assessment;Infant weight loss   P2, Baby 67 hours old.  Mother has baby undressed but baby will not arouse for feeding.  Discussed STS. Reviewed hand expression with good flow bilaterally. Mother pumped last night but only one breast and received 13 ml.  She allowed it to expire before giving it back to her baby. Suggest pumping both breasts at the same time. Suggest since baby is sleepy mother can pump and give her volume pumped. Mother will call for assistance with using curved tip syringe and for observation of latch.    Maternal Data Formula Feeding for Exclusion: No Has patient been taught Hand Expression?: Yes  Feeding Feeding Type: Breast Fed  LATCH Score                   Interventions Interventions: Hand express;DEBP  Lactation Tools Discussed/Used     Consult Status Consult Status: Follow-up Date: 12/08/18 Follow-up type: In-patient    Dahlia ByesBerkelhammer, Ares Tegtmeyer [facility] 12/07/2018, 11:26 AM

## 2018-12-08 ENCOUNTER — Other Ambulatory Visit: Payer: Self-pay

## 2018-12-08 ENCOUNTER — Encounter: Payer: Self-pay | Admitting: Obstetrics & Gynecology

## 2018-12-08 ENCOUNTER — Ambulatory Visit (INDEPENDENT_AMBULATORY_CARE_PROVIDER_SITE_OTHER): Payer: Managed Care, Other (non HMO) | Admitting: Obstetrics & Gynecology

## 2018-12-08 VITALS — BP 138/87 | HR 86 | Ht 66.0 in | Wt 152.0 lb

## 2018-12-08 DIAGNOSIS — J189 Pneumonia, unspecified organism: Secondary | ICD-10-CM | POA: Diagnosis not present

## 2018-12-08 MED ORDER — BREAST PUMP MISC: 0 refills | Status: DC

## 2018-12-08 MED ORDER — ALBUTEROL SULFATE HFA 108 (90 BASE) MCG/ACT IN AERS: 2.0000 | INHALATION_SPRAY | Freq: Four times a day (QID) | RESPIRATORY_TRACT | 2 refills | Status: DC | PRN

## 2018-12-08 MED ORDER — PNV PRENATAL PLUS MULTIVITAMIN 27-1 MG PO TABS: 1.0000 | ORAL_TABLET | Freq: Every day | ORAL | 11 refills | Status: DC

## 2018-12-08 NOTE — Progress Notes (Signed)
Chief Complaint  Patient presents with  . Follow-up      37 y.o. [id number] No LMP recorded. The current method of family planning is none.  Outpatient Encounter Medications as of 12/08/2018  Medication Sig  . cefdinir (OMNICEF) 300 MG capsule Take 1 capsule (300 mg total) by mouth every 12 (twelve) hours for 5 doses.  . chlorpheniramine-HYDROcodone (TUSSIONEX) 10-8 MG/5ML SUER Take 5 mLs by mouth every 12 (twelve) hours as needed for cough.  Marland Kitchen ibuprofen (ADVIL,MOTRIN) 600 MG tablet Take 1 tablet (600 mg total) by mouth every 6 (six) hours.  Marland Kitchen ipratropium (ATROVENT) 0.06 % nasal spray Place 2 sprays into both nostrils 2 (two) times daily as needed for rhinitis. To help with cough  . Prenatal Vit-Fe Fumarate-FA (PNV PRENATAL PLUS MULTIVITAMIN) 27-1 MG TABS Take 1 tablet by mouth daily.   Marland Kitchen albuterol (PROVENTIL HFA;VENTOLIN HFA) 108 (90 Base) MCG/ACT inhaler Inhale 2 puffs into the lungs every 6 (six) hours as needed for wheezing or shortness of breath.  Marland Kitchen amoxicillin-clavulanate (AUGMENTIN) 875-125 MG tablet Take 1 tablet by mouth 2 (two) times daily. (Patient not taking: Reported on 12/08/2018)  . Misc. Devices (BREAST PUMP) MISC Please provide device  . [DISCONTINUED] acetaminophen (TYLENOL) 325 MG tablet Take 325 mg by mouth every 6 (six) hours as needed for mild pain or fever.   No facility-administered encounter medications on file as of 12/08/2018.     Subjective Pt delivered 5 days ago and had CAP received zithromax and rocephin then cefdinir Feels better but still having cough Restart augmentin start inhaler and follow up next week Past Medical History:  Diagnosis Date  . Anemia   . Hx of syncope   . IUGR (intrauterine growth restriction) 09/10/2015  . Shortened PR interval 2017   seen cardiology, no meds needed.     Past Surgical History:  Procedure Laterality Date  . tear duct surgery      OB History    Gravida  2   Para  2   Term  2   Preterm      AB       Living  2     SAB      TAB      Ectopic      Multiple  0   Live Births  2           No Known Allergies  Social History   Socioeconomic History  . Marital status: Married    Spouse name: Not on file  . Number of children: Not on file  . Years of education: Not on file  . Highest education level: Not on file  Occupational History  . Not on file  Social Needs  . Financial resource strain: Not on file  . Food insecurity:    Worry: Not on file    Inability: Not on file  . Transportation needs:    Medical: Not on file    Non-medical: Not on file  Tobacco Use  . Smoking status: Never Smoker  . Smokeless tobacco: Never Used  Substance and Sexual Activity  . Alcohol use: No    Comment: social   . Drug use: No  . Sexual activity: Not Currently    Partners: Male    Birth control/protection: None    Comment: Married  Lifestyle  . Physical activity:    Days per week: Not on file    Minutes per session: Not on file  . Stress: Not  on file  Relationships  . Social connections:    Talks on phone: Not on file    Gets together: Not on file    Attends religious service: Not on file    Active member of club or organization: Not on file    Attends meetings of clubs or organizations: Not on file    Relationship status: Not on file  Other Topics Concern  . Not on file  Social History Narrative   Married.    Master's Degree. 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    Takes a daily vitamin.    Smoke alarm in the home. Wears her seatbelt.    Feels safe in her relationships.     Family History  Problem Relation Age of Onset  . Hypertension Mother   . Misc[place]riages / [place] Mother   . Valvular heart disease Brother        partly fused "cuspid" valves  . Diabetes Other        paternal side of family    Medications:       Current Outpatient Medications:  .  cefdinir (OMNICEF) 300 MG capsule, Take 1 capsule (300 mg total) by mouth every 12 (twelve) hours for 5  doses., Disp: 5 capsule, Rfl: 0 .  chlorpheniramine-HYDROcodone (TUSSIONEX) 10-8 MG/5ML SUER, Take 5 mLs by mouth every 12 (twelve) hours as needed for cough., Disp: 140 mL, Rfl: 0 .  ibuprofen (ADVIL,MOTRIN) 600 MG tablet, Take 1 tablet (600 mg total) by mouth every 6 (six) hours., Disp: 30 tablet, Rfl: 0 .  ipratropium (ATROVENT) 0.06 % nasal spray, Place 2 sprays into both nostrils 2 (two) times daily as needed for rhinitis. To help with cough, Disp: 15 mL, Rfl: 0 .  Prenatal Vit-Fe Fumarate-FA (PNV PRENATAL PLUS MULTIVITAMIN) 27-1 MG TABS, Take 1 tablet by mouth daily. , Disp: , Rfl:  .  albuterol (PROVENTIL HFA;VENTOLIN HFA) 108 (90 Base) MCG/ACT inhaler, Inhale 2 puffs into the lungs every 6 (six) hours as needed for wheezing or shortness of breath., Disp: 1 Inhaler, Rfl: 2 .  amoxicillin-clavulanate (AUGMENTIN) 875-125 MG tablet, Take 1 tablet by mouth 2 (two) times daily. (Patient not taking: Reported on 12/08/2018), Disp: 20 tablet, Rfl: 0 .  Misc. Devices (BREAST PUMP) MISC, Please provide device, Disp: 1 each, Rfl: 0  Objective Blood pressure 138/87, pulse 86, height 5\' 6"  (1.676 m), weight 152 lb (68.9 kg), currently breastfeeding.  Lungs course rhochi and wheezing throughout both lung fields No consolidation  Pertinent ROS   Labs or studies     Impression Diagnoses this Encounter::   ICD-10-CM   1. Community acquired pneumonia, bilateral J18.9     Established relevant diagnosis(es):   Plan/Recommendations: Meds ordered this encounter  Medications  . albuterol (PROVENTIL HFA;VENTOLIN HFA) 108 (90 Base) MCG/ACT inhaler    Sig: Inhale 2 puffs into the lungs every 6 (six) hours as needed for wheezing or shortness of breath.    Dispense:  1 Inhaler    Refill:  2  . Misc. Devices (BREAST PUMP) MISC    Sig: Please provide device    Dispense:  1 each    Refill:  0    Labs or Scans Ordered: No orders of the defined types were placed in this  encounter.   Management:: >finish cefdinir then restart the augmentin >begin albuterol inhaler >follow up next week and if still as much airway reaction then will give a course of steroids  Follow up Return in about 1 week (around  12/15/2018) for Follow up, with Dr Despina Hidden.        All questions were answered.

## 2018-12-13 ENCOUNTER — Encounter: Payer: Self-pay | Admitting: Obstetrics & Gynecology

## 2018-12-16 ENCOUNTER — Ambulatory Visit (INDEPENDENT_AMBULATORY_CARE_PROVIDER_SITE_OTHER): Payer: Managed Care, Other (non HMO) | Admitting: Obstetrics & Gynecology

## 2018-12-16 ENCOUNTER — Other Ambulatory Visit: Payer: Self-pay

## 2018-12-16 ENCOUNTER — Encounter (INDEPENDENT_AMBULATORY_CARE_PROVIDER_SITE_OTHER): Payer: Self-pay

## 2018-12-16 ENCOUNTER — Encounter: Payer: Self-pay | Admitting: Obstetrics & Gynecology

## 2018-12-16 VITALS — BP 122/74 | HR 85 | Ht 66.0 in | Wt 144.0 lb

## 2018-12-16 DIAGNOSIS — J189 Pneumonia, unspecified organism: Secondary | ICD-10-CM | POA: Diagnosis not present

## 2018-12-16 NOTE — Progress Notes (Signed)
Chief Complaint  Patient presents with  . Follow-up      37 y.o. [id number] No LMP recorded. The current method of family planning is none.  Outpatient Encounter Medications as of 12/16/2018  Medication Sig  . albuterol (PROVENTIL HFA;VENTOLIN HFA) 108 (90 Base) MCG/ACT inhaler Inhale 2 puffs into the lungs every 6 (six) hours as needed for wheezing or shortness of breath.  Marland Kitchen. amoxicillin-clavulanate (AUGMENTIN) 875-125 MG tablet Take 1 tablet by mouth 2 (two) times daily.  Marland Kitchen. ipratropium (ATROVENT) 0.06 % nasal spray Place 2 sprays into both nostrils 2 (two) times daily as needed for rhinitis. To help with cough  . Misc. Devices (BREAST PUMP) MISC Please provide device  . Prenatal Vit-Fe Fumarate-FA (PNV PRENATAL PLUS MULTIVITAMIN) 27-1 MG TABS Take 1 tablet by mouth daily.  . chlorpheniramine-HYDROcodone (TUSSIONEX) 10-8 MG/5ML SUER Take 5 mLs by mouth every 12 (twelve) hours as needed for cough. (Patient not taking: Reported on 12/16/2018)  . [DISCONTINUED] ibuprofen (ADVIL,MOTRIN) 600 MG tablet Take 1 tablet (600 mg total) by mouth every 6 (six) hours.   No facility-administered encounter medications on file as of 12/16/2018.     Subjective Follow up pneumonia Feels much better No fever Productive cough much less Past Medical History:  Diagnosis Date  . Anemia   . Hx of syncope   . IUGR (intrauterine growth restriction) 09/10/2015  . Shortened PR interval 2017   seen cardiology, no meds needed.     Past Surgical History:  Procedure Laterality Date  . tear duct surgery      OB History    Gravida  2   Para  2   Term  2   Preterm      AB      Living  2     SAB      TAB      Ectopic      Multiple  0   Live Births  2           No Known Allergies  Social History   Socioeconomic History  . Marital status: Married    Spouse name: Not on file  . Number of children: Not on file  . Years of education: Not on file  . Highest education level:  Not on file  Occupational History  . Not on file  Social Needs  . Financial resource strain: Not on file  . Food insecurity:    Worry: Not on file    Inability: Not on file  . Transportation needs:    Medical: Not on file    Non-medical: Not on file  Tobacco Use  . Smoking status: Never Smoker  . Smokeless tobacco: Never Used  Substance and Sexual Activity  . Alcohol use: No    Comment: social   . Drug use: No  . Sexual activity: Not Currently    Partners: Male    Birth control/protection: None    Comment: Married  Lifestyle  . Physical activity:    Days per week: Not on file    Minutes per session: Not on file  . Stress: Not on file  Relationships  . Social connections:    Talks on phone: Not on file    Gets together: Not on file    Attends religious service: Not on file    Active member of club or organization: Not on file    Attends meetings of clubs or organizations: Not on file    Relationship status:  Not on file  Other Topics Concern  . Not on file  Social History Narrative   Married.    Master's Degree. 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    Takes a daily vitamin.    Smoke alarm in the home. Wears her seatbelt.    Feels safe in her relationships.     Family History  Problem Relation Age of Onset  . Hypertension Mother   . Miscarriages / [place] Mother   . Valvular heart disease Brother        partly fused "cuspid" valves  . Diabetes Other        paternal side of family    Medications:       Current Outpatient Medications:  .  albuterol (PROVENTIL HFA;VENTOLIN HFA) 108 (90 Base) MCG/ACT inhaler, Inhale 2 puffs into the lungs every 6 (six) hours as needed for wheezing or shortness of breath., Disp: 1 Inhaler, Rfl: 2 .  amoxicillin-clavulanate (AUGMENTIN) 875-125 MG tablet, Take 1 tablet by mouth 2 (two) times daily., Disp: 20 tablet, Rfl: 0 .  ipratropium (ATROVENT) 0.06 % nasal spray, Place 2 sprays into both nostrils 2 (two) times daily as needed for  rhinitis. To help with cough, Disp: 15 mL, Rfl: 0 .  Misc. Devices (BREAST PUMP) MISC, Please provide device, Disp: 1 each, Rfl: 0 .  Prenatal Vit-Fe Fumarate-FA (PNV PRENATAL PLUS MULTIVITAMIN) 27-1 MG TABS, Take 1 tablet by mouth daily., Disp: 30 tablet, Rfl: 11 .  chlorpheniramine-HYDROcodone (TUSSIONEX) 10-8 MG/5ML SUER, Take 5 mLs by mouth every 12 (twelve) hours as needed for cough. (Patient not taking: Reported on 12/16/2018), Disp: 140 mL, Rfl: 0  Objective Blood pressure 122/74, pulse 85, height 5\' 6"  (1.676 m), weight 144 lb (65.3 kg), currently breastfeeding.  Lungs slight wheeze, resolution of wet sounds  Pertinent ROS   Labs or studies     Impression Diagnoses this Encounter::   ICD-10-CM   1. Community acquired pneumonia, bilateral J18.9     Established relevant diagnosis(es):   Plan/Recommendations: No orders of the defined types were placed in this encounter.   Labs or Scans Ordered: No orders of the defined types were placed in this encounter.   Management:: >finish up the augmentin >no steroids needed >Use inhaler 2-3 times per day and as needed then can stop  Follow up No follow-ups on file.       All questions were answered.

## 2018-12-19 ENCOUNTER — Telehealth: Payer: Self-pay | Admitting: Obstetrics & Gynecology

## 2018-12-19 ENCOUNTER — Telehealth: Payer: Self-pay | Admitting: *Deleted

## 2018-12-19 MED ORDER — PREDNISONE 10 MG PO TABS: ORAL_TABLET | ORAL | 0 refills | Status: DC

## 2018-12-19 NOTE — Telephone Encounter (Signed)
Informed pt that rx for prednisone had been sent to pharmacy.

## 2018-12-30 ENCOUNTER — Encounter: Payer: Self-pay | Admitting: Obstetrics & Gynecology

## 2018-12-30 ENCOUNTER — Ambulatory Visit (INDEPENDENT_AMBULATORY_CARE_PROVIDER_SITE_OTHER): Payer: Managed Care, Other (non HMO) | Admitting: Obstetrics & Gynecology

## 2018-12-30 VITALS — BP 111/76 | HR 77 | Ht 66.0 in | Wt 140.0 lb

## 2018-12-30 DIAGNOSIS — J189 Pneumonia, unspecified organism: Secondary | ICD-10-CM

## 2018-12-30 NOTE — Progress Notes (Signed)
Chief Complaint  Patient presents with  . work-in-gyn    had pneumonia, bilateral      38 y.o. [id number] No LMP recorded. The current method of family planning is breast feeding.  Outpatient Encounter Medications as of 12/30/2018  Medication Sig  . albuterol (PROVENTIL HFA;VENTOLIN HFA) 108 (90 Base) MCG/ACT inhaler Inhale 2 puffs into the lungs every 6 (six) hours as needed for wheezing or shortness of breath.  . Misc. Devices (BREAST PUMP) MISC Please provide device  . Prenatal Vit-Fe Fumarate-FA (PNV PRENATAL PLUS MULTIVITAMIN) 27-1 MG TABS Take 1 tablet by mouth daily.  Marland Kitchen amoxicillin-clavulanate (AUGMENTIN) 875-125 MG tablet Take 1 tablet by mouth 2 (two) times daily. (Patient not taking: Reported on 12/30/2018)  . chlorpheniramine-HYDROcodone (TUSSIONEX) 10-8 MG/5ML SUER Take 5 mLs by mouth every 12 (twelve) hours as needed for cough. (Patient not taking: Reported on 12/16/2018)  . ipratropium (ATROVENT) 0.06 % nasal spray Place 2 sprays into both nostrils 2 (two) times daily as needed for rhinitis. To help with cough (Patient not taking: Reported on 12/30/2018)  . predniSONE (DELTASONE) 10 MG tablet Take 4 tablets all at once daily for 10 days (Patient not taking: Reported on 12/30/2018)   No facility-administered encounter medications on file as of 12/30/2018.     Subjective Pt contacted me via MyChart with concern that her cough has worsened over the past day or so She has completed antibiotics for a bilateral CAP with azithromycin, augmentin and cefdinir + a course of antibiotics and an inhaler No fever and some production from the cough Past Medical History:  Diagnosis Date  . Anemia   . Hx of syncope   . IUGR (intrauterine growth restriction) 09/10/2015  . Shortened PR interval 2017   seen cardiology, no meds needed.     Past Surgical History:  Procedure Laterality Date  . tear duct surgery      OB History    Gravida  2   Para  2   Term  2   Preterm      AB        Living  2     SAB      TAB      Ectopic      Multiple  0   Live Births  2           No Known Allergies  Social History   Socioeconomic History  . Marital status: Married    Spouse name: Not on file  . Number of children: Not on file  . Years of education: Not on file  . Highest education level: Not on file  Occupational History  . Not on file  Social Needs  . Financial resource strain: Not on file  . Food insecurity:    Worry: Not on file    Inability: Not on file  . Transportation needs:    Medical: Not on file    Non-medical: Not on file  Tobacco Use  . Smoking status: Never Smoker  . Smokeless tobacco: Never Used  Substance and Sexual Activity  . Alcohol use: No    Comment: social   . Drug use: No  . Sexual activity: Not Currently    Partners: Male    Birth control/protection: None    Comment: Married  Lifestyle  . Physical activity:    Days per week: Not on file    Minutes per session: Not on file  . Stress: Not on file  Relationships  .  Social connections:    Talks on phone: Not on file    Gets together: Not on file    Attends religious service: Not on file    Active member of club or organization: Not on file    Attends meetings of clubs or organizations: Not on file    Relationship status: Not on file  Other Topics Concern  . Not on file  Social History Narrative   Married.    Master's Degree. 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    Takes a daily vitamin.    Smoke alarm in the home. Wears her seatbelt.    Feels safe in her relationships.     Family History  Problem Relation Age of Onset  . Hypertension Mother   . Miscarriages / [place] Mother   . Valvular heart disease Brother        partly fused "cuspid" valves  . Diabetes Other        paternal side of family    Medications:       Current Outpatient Medications:  .  albuterol (PROVENTIL HFA;VENTOLIN HFA) 108 (90 Base) MCG/ACT inhaler, Inhale 2 puffs into the lungs every 6  (six) hours as needed for wheezing or shortness of breath., Disp: 1 Inhaler, Rfl: 2 .  Misc. Devices (BREAST PUMP) MISC, Please provide device, Disp: 1 each, Rfl: 0 .  Prenatal Vit-Fe Fumarate-FA (PNV PRENATAL PLUS MULTIVITAMIN) 27-1 MG TABS, Take 1 tablet by mouth daily., Disp: 30 tablet, Rfl: 11 .  amoxicillin-clavulanate (AUGMENTIN) 875-125 MG tablet, Take 1 tablet by mouth 2 (two) times daily. (Patient not taking: Reported on 12/30/2018), Disp: 20 tablet, Rfl: 0 .  chlorpheniramine-HYDROcodone (TUSSIONEX) 10-8 MG/5ML SUER, Take 5 mLs by mouth every 12 (twelve) hours as needed for cough. (Patient not taking: Reported on 12/16/2018), Disp: 140 mL, Rfl: 0 .  ipratropium (ATROVENT) 0.06 % nasal spray, Place 2 sprays into both nostrils 2 (two) times daily as needed for rhinitis. To help with cough (Patient not taking: Reported on 12/30/2018), Disp: 15 mL, Rfl: 0 .  predniSONE (DELTASONE) 10 MG tablet, Take 4 tablets all at once daily for 10 days (Patient not taking: Reported on 12/30/2018), Disp: 40 tablet, Rfl: 0  Objective Blood pressure 111/76, pulse 77, height 5\' 6"  (1.676 m), weight 140 lb (63.5 kg), currently breastfeeding.  Lungs clear in al lung fields including RML without any wheezing or upper respiratory stridor sounds, completely normal exam Pt is breathing easily and there is no coughing during deep inspiration  Pertinent ROS No burning with urination, frequency or urgency No nausea, vomiting or diarrhea Nor fever chills or other constitutional symptoms   Labs or studies No new    Impression Diagnoses this Encounter::   ICD-10-CM   1. Community acquired pneumonia, bilateral J18.9     Established relevant diagnosis(es):   Plan/Recommendations: No orders of the defined types were placed in this encounter.   Labs or Scans Ordered: No orders of the defined types were placed in this encounter.   Management:: >her older daughter has had a cold and this may be what Tachelle is  experiencing.  Her lung exam is completely normal without any evidence of reactive airway disease either >at this point I have recommended immune strong and bronchial support from [place] botanicals to improve her respiratory immune function during the winter season, I recommend she stay on it until spring arrives say middle of April  In the meantime she understands she will be more susceptible to URI and I  want to avoid any unnecessary antibiotics at this point since she has had exposure to so much over the past several weeks  Follow up Return for keep scheduled.     All questions were answered.

## 2019-01-12 ENCOUNTER — Ambulatory Visit (INDEPENDENT_AMBULATORY_CARE_PROVIDER_SITE_OTHER): Payer: Managed Care, Other (non HMO) | Admitting: Obstetrics & Gynecology

## 2019-01-12 ENCOUNTER — Other Ambulatory Visit: Payer: Self-pay

## 2019-01-12 ENCOUNTER — Encounter: Payer: Self-pay | Admitting: Obstetrics & Gynecology

## 2019-01-12 VITALS — BP 127/78 | HR 76 | Ht 66.0 in | Wt 143.0 lb

## 2019-01-12 DIAGNOSIS — Z3202 Encounter for pregnancy test, result negative: Secondary | ICD-10-CM

## 2019-01-12 LAB — POCT URINE PREGNANCY: Preg Test, Ur: NEGATIVE

## 2019-01-12 MED ORDER — NORGESTIM-ETH ESTRAD TRIPHASIC 0.18/0.215/0.25 MG-25 MCG PO TABS: 1.0000 | ORAL_TABLET | Freq: Every day | ORAL | 11 refills | Status: DC

## 2019-01-12 NOTE — Progress Notes (Signed)
Subjective:     Yolanda Williams is a 38 y.o. female who presents for a postpartum visit. She is 6 weeks postpartum following a spontaneous vaginal delivery. I have fully reviewed the prenatal and intrapartum course. The delivery was at 39 gestational weeks. Outcome: spontaneous vaginal delivery. Anesthesia: epidural. Postpartum course has been significant for pneumonia. Baby's course has been normal. Baby is feeding by breast. Bleeding no bleeding. Bowel function is normal. Bladder function is normal. Patient is sexually active. Contraception method is none. Postpartum depression screening: negative.  The following portions of the patient's history were reviewed and updated as appropriate: allergies, current medications, past family history, past medical history, past social history, past surgical history and problem list.  Review of Systems Pertinent items are noted in HPI.   Objective:    BP 127/78 (BP Location: Left Arm, Patient Position: Sitting, Cuff Size: Normal)   Pulse 76   Ht 5\' 6"  (1.676 m)   Wt 143 lb (64.9 kg)   Breastfeeding Yes   BMI 23.08 kg/m   General:  alert, cooperative and no distress   Breasts:    Lungs: wheezes apex - bilateral  Heart:    Abdomen:    Vulva:  normal  Vagina: normal  Cervix:  no cervical motion tenderness and no lesions  Corpus: normal size, contour, position, consistency, mobility, non-tender  Adnexa:  normal adnexa  Rectal Exam:         Assessment:     normal postpartum exam. Pap smear not done at today's visit.   Plan:    1. Contraception: OCP (estrogen/progesterone) 2.  Meds ordered this encounter  Medications  . Norgestimate-Ethinyl Estradiol Triphasic (ORTHO TRI-CYCLEN LO) 0.18/0.215/0.25 MG-25 MCG tab    Sig: Take 1 tablet by mouth daily.    Dispense:  1 Package    Refill:  11    3. Follow up in: prn  or as needed.

## 2019-01-13 ENCOUNTER — Ambulatory Visit: Payer: Managed Care, Other (non HMO) | Admitting: Obstetrics & Gynecology

## 2019-01-13 ENCOUNTER — Ambulatory Visit: Payer: Managed Care, Other (non HMO) | Admitting: Advanced Practice Midwife

## 2019-01-23 ENCOUNTER — Other Ambulatory Visit: Payer: Self-pay | Admitting: Obstetrics & Gynecology

## 2019-01-23 MED ORDER — NORETHINDRONE 0.35 MG PO TABS: 1.0000 | ORAL_TABLET | Freq: Every day | ORAL | 11 refills | Status: DC

## 2019-03-15 ENCOUNTER — Encounter: Payer: Managed Care, Other (non HMO) | Admitting: Family Medicine

## 2019-09-08 ENCOUNTER — Encounter: Payer: Self-pay | Admitting: Family Medicine

## 2019-09-08 ENCOUNTER — Other Ambulatory Visit: Payer: Self-pay

## 2019-09-08 ENCOUNTER — Ambulatory Visit (INDEPENDENT_AMBULATORY_CARE_PROVIDER_SITE_OTHER): Payer: Managed Care, Other (non HMO) | Admitting: Family Medicine

## 2019-09-08 VITALS — BP 96/66 | HR 83 | Temp 98.4°F | Resp 16 | Ht 66.5 in | Wt 133.1 lb

## 2019-09-08 DIAGNOSIS — Z23 Encounter for immunization: Secondary | ICD-10-CM | POA: Diagnosis not present

## 2019-09-08 DIAGNOSIS — Z79899 Other long term (current) drug therapy: Secondary | ICD-10-CM

## 2019-09-08 DIAGNOSIS — Z131 Encounter for screening for diabetes mellitus: Secondary | ICD-10-CM

## 2019-09-08 DIAGNOSIS — Z793 Long term (current) use of hormonal contraceptives: Secondary | ICD-10-CM | POA: Diagnosis not present

## 2019-09-08 DIAGNOSIS — N912 Amenorrhea, unspecified: Secondary | ICD-10-CM

## 2019-09-08 DIAGNOSIS — L989 Disorder of the skin and subcutaneous tissue, unspecified: Secondary | ICD-10-CM

## 2019-09-08 DIAGNOSIS — Z0000 Encounter for general adult medical examination without abnormal findings: Secondary | ICD-10-CM

## 2019-09-08 DIAGNOSIS — Z130 Encounter for screening for diseases of the blood and blood-forming organs and certain disorders involving the immune mechanism: Secondary | ICD-10-CM | POA: Diagnosis not present

## 2019-09-08 LAB — COMPREHENSIVE METABOLIC PANEL
ALT: 8 U/L (ref 0–35)
AST: 14 U/L (ref 0–37)
Albumin: 4.5 g/dL (ref 3.5–5.2)
Alkaline Phosphatase: 81 U/L (ref 39–117)
BUN: 12 mg/dL (ref 6–23)
CO2: 31 mEq/L (ref 19–32)
Calcium: 9.6 mg/dL (ref 8.4–10.5)
Chloride: 101 mEq/L (ref 96–112)
Creatinine, Ser: 0.64 mg/dL (ref 0.40–1.20)
GFR: 103.18 mL/min (ref >60.00)
Glucose, Bld: 65 mg/dL — ABNORMAL LOW (ref 70–99)
Potassium: 4.1 mEq/L (ref 3.5–5.1)
Sodium: 140 mEq/L (ref 135–145)
Total Bilirubin: 0.6 mg/dL (ref 0.2–1.2)
Total Protein: 7 g/dL (ref 6.0–8.3)

## 2019-09-08 LAB — CBC
HCT: 40.2 % (ref 36.0–46.0)
Hemoglobin: 13.3 g/dL (ref 12.0–15.0)
MCHC: 33.2 g/dL (ref 30.0–36.0)
MCV: 94.3 fl (ref 78.0–100.0)
Platelets: 205 10*3/uL (ref 150.0–400.0)
RBC: 4.26 Mil/uL (ref 3.87–5.11)
RDW: 12.6 % (ref 11.5–15.5)
WBC: 6.4 10*3/uL (ref 4.0–10.5)

## 2019-09-08 LAB — HEMOGLOBIN A1C: Hgb A1c MFr Bld: 5.4 % (ref 4.6–6.5)

## 2019-09-08 LAB — TSH: TSH: 1.82 u[IU]/mL (ref 0.35–4.50)

## 2019-09-08 LAB — LIPID PANEL
Cholesterol: 160 mg/dL (ref 0–200)
HDL: 62.3 mg/dL (ref >39.00)
LDL Cholesterol: 85 mg/dL (ref 0–99)
NonHDL: 97.95
Total CHOL/HDL Ratio: 3
Triglycerides: 63 mg/dL (ref 0.0–149.0)
VLDL: 12.6 mg/dL (ref 0.0–40.0)

## 2019-09-08 NOTE — Patient Instructions (Signed)
Health Maintenance, Female Adopting a healthy lifestyle and getting preventive care are important in promoting health and wellness. Ask your health care provider about:  The right schedule for you to have regular tests and exams.  Things you can do on your own to prevent diseases and keep yourself healthy. What should I know about diet, weight, and exercise? Eat a healthy diet   Eat a diet that includes plenty of vegetables, fruits, low-fat dairy products, and lean protein.  Do not eat a lot of foods that are high in solid fats, added sugars, or sodium. Maintain a healthy weight Body mass index (BMI) is used to identify weight problems. It estimates body fat based on height and weight. Your health care provider can help determine your BMI and help you achieve or maintain a healthy weight. Get regular exercise Get regular exercise. This is one of the most important things you can do for your health. Most adults should:  Exercise for at least 150 minutes each week. The exercise should increase your heart rate and make you sweat (moderate-intensity exercise).  Do strengthening exercises at least twice a week. This is in addition to the moderate-intensity exercise.  Spend less time sitting. Even light physical activity can be beneficial. Watch cholesterol and blood lipids Have your blood tested for lipids and cholesterol at 39 years of age, then have this test every 5 years. Have your cholesterol levels checked more often if:  Your lipid or cholesterol levels are high.  You are older than 40 years of age.  You are at high risk for heart disease. What should I know about cancer screening? Depending on your health history and family history, you may need to have cancer screening at various ages. This may include screening for:  Breast cancer.  Cervical cancer.  Colorectal cancer.  Skin cancer.  Lung cancer. What should I know about heart disease, diabetes, and high blood  pressure? Blood pressure and heart disease  High blood pressure causes heart disease and increases the risk of stroke. This is more likely to develop in people who have high blood pressure readings, are of African descent, or are overweight.  Have your blood pressure checked: ? Every 3-5 years if you are 18-39 years of age. ? Every year if you are 40 years old or older. Diabetes Have regular diabetes screenings. This checks your fasting blood sugar level. Have the screening done:  Once every three years after age 40 if you are at a normal weight and have a low risk for diabetes.  More often and at a younger age if you are overweight or have a high risk for diabetes. What should I know about preventing infection? Hepatitis B If you have a higher risk for hepatitis B, you should be screened for this virus. Talk with your health care provider to find out if you are at risk for hepatitis B infection. Hepatitis C Testing is recommended for:  Everyone born from 1945 through 1965.  Anyone with known risk factors for hepatitis C. Sexually transmitted infections (STIs)  Get screened for STIs, including gonorrhea and chlamydia, if: ? You are sexually active and are younger than 39 years of age. ? You are older than 39 years of age and your health care provider tells you that you are at risk for this type of infection. ? Your sexual activity has changed since you were last screened, and you are at increased risk for chlamydia or gonorrhea. Ask your health care provider if   you are at risk.  Ask your health care provider about whether you are at high risk for HIV. Your health care provider may recommend a prescription medicine to help prevent HIV infection. If you choose to take medicine to prevent HIV, you should first get tested for HIV. You should then be tested every 3 months for as long as you are taking the medicine. Pregnancy  If you are about to stop having your period (premenopausal) and  you may become pregnant, seek counseling before you get pregnant.  Take 400 to 800 micrograms (mcg) of folic acid every day if you become pregnant.  Ask for birth control (contraception) if you want to prevent pregnancy. Osteoporosis and menopause Osteoporosis is a disease in which the bones lose minerals and strength with aging. This can result in bone fractures. If you are 65 years old or older, or if you are at risk for osteoporosis and fractures, ask your health care provider if you should:  Be screened for bone loss.  Take a calcium or vitamin D supplement to lower your risk of fractures.  Be given hormone replacement therapy (HRT) to treat symptoms of menopause. Follow these instructions at home: Lifestyle  Do not use any products that contain nicotine or tobacco, such as cigarettes, e-cigarettes, and chewing tobacco. If you need help quitting, ask your health care provider.  Do not use street drugs.  Do not share needles.  Ask your health care provider for help if you need support or information about quitting drugs. Alcohol use  Do not drink alcohol if: ? Your health care provider tells you not to drink. ? You are pregnant, may be pregnant, or are planning to become pregnant.  If you drink alcohol: ? Limit how much you use to 0-1 drink a day. ? Limit intake if you are breastfeeding.  Be aware of how much alcohol is in your drink. In the [place] one drink equals one 12 oz bottle of beer (355 mL), one 5 oz glass of wine (148 mL), or one 1 oz glass of hard liquor (44 mL). General instructions  Schedule regular health, dental, and eye exams.  Stay current with your vaccines.  Tell your health care provider if: ? You often feel depressed. ? You have ever been abused or do not feel safe at home. Summary  Adopting a healthy lifestyle and getting preventive care are important in promoting health and wellness.  Follow your health care provider's instructions about healthy  diet, exercising, and getting tested or screened for diseases.  Follow your health care provider's instructions on monitoring your cholesterol and blood pressure. This information is not intended to replace advice given to you by your health care provider. Make sure you discuss any questions you have with your health care provider. Document Released: 06/29/2011 Document Revised: 12/07/2018 Document Reviewed: 12/07/2018 Elsevier Patient Education  2020 Elsevier Inc.  

## 2019-09-08 NOTE — Progress Notes (Signed)
Patient ID: Yolanda Williams, female  DOB: 1981/02/09, 38 y.o.   MRN: [id number] Patient Care Team    Relationship Specialty Notifications Start End  Natalia LeatherwoodKuneff, Sharalyn Lomba A, DO PCP - General Family Medicine  12/08/17   Sherian ReinBovard-Stuckert, Jody, MD Consulting Physician Obstetrics and Gynecology  11/16/17   Chrystie NoseHilty, Kenneth C, MD Consulting Physician Cardiology  11/16/17     Chief Complaint  Patient presents with  . Annual Exam    Not fasting. Pt has had moles on back that have been looked at before and wants them looked at again today     Subjective:  Yolanda Williams is a 38 y.o.  Female  present for CPE. All past medical history, surgical history, allergies, family history, immunizations, medications and social history were updated in the electronic medical record today. All recent labs, ED visits and hospitalizations within the last year were reviewed.  Health maintenance:  Colonoscopy: no fhx, screen at 50. Mammogram: No fhx. Screen at 40. No LMP recorded. breast feeding.  Cervical cancer screening: last pap: 04/2017, results: pt reports normal , completed by: Dr. Ellyn HackBovard Immunizations: tdap 2019 UTD, Influenza completed today Infectious disease screening: HIV completed DEXA: screen at 60-65 Assistive device: none Oxygen NWG:NFAOuse:none Patient has a Dental home. Hospitalizations/ED visits: none  [facility] 09/08/2019 11/16/2017  Decreased Interest 0 0  Down, Depressed, Hopeless 0 0  PHQ - 2 Score 0 0   No flowsheet data found.   Immunization History  Administered Date(s) Administered  . Influenza Split 09/27/2012, 08/13/2015, 10/09/2017  . Influenza,inj,Quad PF,6+ Mos 09/02/2018, 09/08/2019  . Influenza-Unspecified 10/05/2011, 10/13/2012, 10/11/2016  . Tdap 06/27/2015, 09/23/2018     Past Medical History:  Diagnosis Date  . Anemia   . Hx of syncope   . IUGR (intrauterine growth restriction) 09/10/2015  . Shortened PR interval 2017   seen cardiology, no meds  needed.    No Known Allergies Past Surgical History:  Procedure Laterality Date  . tear duct surgery     Family History  Problem Relation Age of Onset  . Hypertension Mother   . Miscarriages / [place] Mother   . Valvular heart disease Brother        partly fused "cuspid" valves  . Diabetes Other        paternal side of family   Social History   Social History Narrative   Married.    Master's Degree. 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    Takes a daily vitamin.    Smoke alarm in the home. Wears her seatbelt.    Feels safe in her relationships.     Allergies as of 09/08/2019   No Known Allergies     Medication List       Accurate as of September 08, 2019 12:58 PM. If you have any questions, ask your nurse or doctor.        STOP taking these medications   Norgestimate-Ethinyl Estradiol Triphasic 0.18/0.215/0.25 MG-25 MCG tab Commonly known as: Ortho Tri-Cyclen Lo Stopped by: Felix Pacinienee Omid Deardorff, DO     TAKE these medications   albuterol 108 (90 Base) MCG/ACT inhaler Commonly known as: VENTOLIN HFA Inhale 2 puffs into the lungs every 6 (six) hours as needed for wheezing or shortness of breath.   Breast Pump Misc Please provide device   norethindrone 0.35 MG tablet Commonly known as: MICRONOR Take 1 tablet (0.35 mg total) by mouth daily. Take 1 a day   PNV Prenatal Plus Multivitamin 27-1 MG  Tabs Take 1 tablet by mouth daily.       All past medical history, surgical history, allergies, family history, immunizations andmedications were updated in the EMR today and reviewed under the history and medication portions of their EMR.     No results found for this or any previous visit (from the past 2160 hour(s)).   ROS: 14 pt review of systems performed and negative (unless mentioned in an HPI)  Objective: BP 96/66 (BP Location: Left Arm, Patient Position: Sitting, Cuff Size: Normal)   Pulse 83   Temp 98.4 F (36.9 C) (Temporal)   Resp 16   Ht 5' 6.5" (1.689 m)    Wt 133 lb 2 oz (60.4 kg)   SpO2 97%   Breastfeeding Yes   BMI 21.17 kg/m  Gen: Afebrile. No acute distress. Nontoxic in appearance, well-developed, well-nourished, pleasant, Caucasian female. HENT: AT. Lydia. Bilateral TM visualized and normal in appearance, normal external auditory canal. MMM, no oral lesions, adequate dentition. Bilateral nares within normal limits. Throat without erythema, ulcerations or exudates.  No cough on exam, no hoarseness on exam. Eyes:Pupils Equal Round Reactive to light, Extraocular movements intact,  Conjunctiva without redness, discharge or icterus. Neck/lymp/endocrine: Supple, no lymphadenopathy, no thyromegaly CV: RRR no murmur, no edema, +2/4 P posterior tibialis pulses.  No carotid bruits. No JVD. Chest: CTAB, no wheeze, rhonchi or crackles.  Normal respiratory effort.  Good air movement. Abd: Soft.  Flat. NTND. BS present.  No masses palpated. No hepatosplenomegaly. No rebound tenderness or guarding. Skin: No rashes, purpura or petechiae. Warm and well-perfused. Skin intact. 0.5 cm x1cm raised mildly hyperpigmented lesion with irritation in 2 darker hyperpigmentation areas within midline thoracic back along bra line. Neuro/Msk:  Normal gait. PERLA. EOMi. Alert. Oriented x3.  Cranial nerves II through XII intact. Muscle strength 5/5 upper/lower extremity. DTRs equal bilaterally. Psych: Normal affect, dress and demeanor. Normal speech. Normal thought content and judgment.   No exam data present  Assessment/plan: Yolanda Williams is a 38 y.o. female present for CPE Need for influenza vaccination - Flu Vaccine QUAD 36+ mos IM Amenorrhea - breast feeding - TSH Screening for deficiency anemia - CBC Long term (current) use of hormonal contraceptives - Comprehensive metabolic panel - Lipid panel Encounter for long-term (current) use of medications - Comprehensive metabolic panel Screening for diabetes mellitus - Hemoglobin A1c Skin lesion: -Skin lesion  half centimeter by 1 cm midline thoracic back at bra line.  Does have some changes and irritation within lesion.  Recommend removal to be safe.  Patient does not desire referral to dermatology at this time but will consider having removed here within the next few months. Encounter for preventive health examination Patient was encouraged to exercise greater than 150 minutes a week. Patient was encouraged to choose a diet filled with fresh fruits and vegetables, and lean meats. AVS provided to patient today for education/recommendation on gender specific health and safety maintenance. Colonoscopy: no fhx, screen at 50. Mammogram: No fhx. Screen at 40. No LMP recorded. breast feeding.  Cervical cancer screening: last pap: 04/2017, results: pt reports normal , completed by: Dr. Melba Coon Immunizations: tdap 2019 UTD, Influenza completed today Infectious disease screening: HIV completed DEXA: screen at 60-65  Return in about 1 year (around 09/07/2020) for CPE (30 min). If patient desires skin lesion removal she will call back to make a procedure appointment for skin lesion excision (30-minute appointment needed)  Electronically signed by: Howard Pouch, DO [place]

## 2019-12-31 ENCOUNTER — Other Ambulatory Visit: Payer: Self-pay | Admitting: Obstetrics & Gynecology

## 2020-01-06 ENCOUNTER — Other Ambulatory Visit: Payer: Self-pay | Admitting: Obstetrics & Gynecology

## 2020-01-29 ENCOUNTER — Ambulatory Visit: Payer: Managed Care, Other (non HMO) | Admitting: Family Medicine

## 2020-01-29 ENCOUNTER — Telehealth: Payer: Self-pay

## 2020-01-29 NOTE — Telephone Encounter (Signed)
Pt was called by Annabelle Harman and given information and appt was cancelled

## 2020-01-29 NOTE — Telephone Encounter (Signed)
Detailed message left on patients VM by Annabelle Harman letting her know information and to call GYN

## 2020-01-29 NOTE — Telephone Encounter (Signed)
Pt called and made Virtual visit for yeast infection that is getting worse. She has taken 2 Diflucan already. Please advise if patient should contact GYN, Needs to be changed to a in office visit @4pm , etc.   Please advise

## 2020-01-29 NOTE — Telephone Encounter (Signed)
Patient should contact her gynecologist for a pelvic exam  If Diflucan x2 has not helped, it is likely not a yeast infection that is causing her symptoms.  There is not much more we can add virtually for her.  She needs a GYN exam.

## 2020-07-29 ENCOUNTER — Ambulatory Visit (INDEPENDENT_AMBULATORY_CARE_PROVIDER_SITE_OTHER): Payer: Managed Care, Other (non HMO) | Admitting: Obstetrics & Gynecology

## 2020-07-29 ENCOUNTER — Other Ambulatory Visit (HOSPITAL_COMMUNITY)
Admission: RE | Admit: 2020-07-29 | Discharge: 2020-07-29 | Disposition: A | Discharge status: Home / Self Care | Payer: Managed Care, Other (non HMO) | Source: Ambulatory Visit | Attending: Obstetrics & Gynecology | Admitting: Obstetrics & Gynecology

## 2020-07-29 ENCOUNTER — Encounter: Payer: Self-pay | Admitting: Obstetrics & Gynecology

## 2020-07-29 VITALS — BP 124/84 | HR 74 | Ht 66.0 in | Wt 142.5 lb

## 2020-07-29 DIAGNOSIS — Z01419 Encounter for gynecological examination (general) (routine) without abnormal findings: Secondary | ICD-10-CM

## 2020-07-29 DIAGNOSIS — Z3202 Encounter for pregnancy test, result negative: Secondary | ICD-10-CM

## 2020-07-29 LAB — POCT URINE PREGNANCY: Preg Test, Ur: NEGATIVE

## 2020-07-29 MED ORDER — NORGESTIM-ETH ESTRAD TRIPHASIC 0.18/0.215/0.25 MG-25 MCG PO TABS: 1.0000 | ORAL_TABLET | Freq: Every day | ORAL | 4 refills | Status: DC

## 2020-07-29 NOTE — Progress Notes (Signed)
Subjective:     Yolanda Williams is a 39 y.o. female here for a routine exam.  Patient's last menstrual period was 07/14/2020. [id number] Birth Control Method:  micronor Menstrual Calendar(currently): regular  Current complaints: none.   Current acute medical issues:  none   Recent Gynecologic History Patient's last menstrual period was 07/14/2020. Last Pap: 2018,  normal Last mammogram: ,    Past Medical History:  Diagnosis Date  . Anemia   . Hx of syncope   . IUGR (intrauterine growth restriction) 09/10/2015  . Shortened PR interval 2017   seen cardiology, no meds needed.     Past Surgical History:  Procedure Laterality Date  . tear duct surgery      OB History    Gravida  2   Para  2   Term  2   Preterm      AB      Living  2     SAB      TAB      Ectopic      Multiple  0   Live Births  2           Social History   Socioeconomic History  . Marital status: Married    Spouse name: Not on file  . Number of children: Not on file  . Years of education: Not on file  . Highest education level: Not on file  Occupational History  . Not on file  Tobacco Use  . Smoking status: Never Smoker  . Smokeless tobacco: Never Used  Vaping Use  . Vaping Use: Never used  Substance and Sexual Activity  . Alcohol use: No    Comment: social   . Drug use: No  . Sexual activity: Yes    Partners: Male    Birth control/protection: Pill    Comment: Married  Other Topics Concern  . Not on file  Social History Narrative   Married.    Master's Degree. 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    Takes a daily vitamin.    Smoke alarm in the home. Wears her seatbelt.    Feels safe in her relationships.    Social Determinants of Health   Financial Resource Strain: Low Risk   . Difficulty of Paying Living Expenses: Not hard at all  Food Insecurity: No Food Insecurity  . Worried About Programme researcher, broadcasting/film/video in the Last Year: Never true  . Ran Out of Food in the Last Year:  Never true  Transportation Needs: No Transportation Needs  . Lack of Transportation (Medical): No  . Lack of Transportation (Non-Medical): No  Physical Activity: Insufficiently Active  . Days of Exercise per Week: 2 days  . Minutes of Exercise per Session: 20 min  Stress: No Stress Concern Present  . Feeling of Stress : Only a little  Social Connections: Moderately Integrated  . Frequency of Communication with Friends and Family: More than three times a week  . Frequency of Social Gatherings with Friends and Family: Once a week  . Attends Religious Services: 1 to 4 times per year  . Active Member of Clubs or Organizations: No  . Attends Banker Meetings: Never  . Marital Status: Married    Family History  Problem Relation Age of Onset  . Hypertension Mother   . Miscarriages / [place] Mother   . Valvular heart disease Brother        partly fused "cuspid" valves  . Diabetes Other  paternal side of family     Current Outpatient Medications:  .  albuterol (PROVENTIL HFA;VENTOLIN HFA) 108 (90 Base) MCG/ACT inhaler, Inhale 2 puffs into the lungs every 6 (six) hours as needed for wheezing or shortness of breath., Disp: 1 Inhaler, Rfl: 2 .  INCASSIA 0.35 MG tablet, TAKE 1 TABLET (0.35 MG TOTAL) BY MOUTH DAILY., Disp: 84 tablet, Rfl: 3 .  Prenatal Vit-Fe Fumarate-FA (PRENATAL VITAMIN PLUS LOW IRON) 27-1 MG TABS, TAKE 1 TABLET BY MOUTH EVERY DAY, Disp: 90 tablet, Rfl: 3 .  Norgestimate-Ethinyl Estradiol Triphasic (ORTHO TRI-CYCLEN LO) 0.18/0.215/0.25 MG-25 MCG tab, Take 1 tablet by mouth daily., Disp: 84 tablet, Rfl: 4  Review of Systems  Review of Systems  Constitutional: Negative for fever, chills, weight loss, malaise/fatigue and diaphoresis.  HENT: Negative for hearing loss, ear pain, nosebleeds, congestion, sore throat, neck pain, tinnitus and ear discharge.   Eyes: Negative for blurred vision, double vision, photophobia, pain, discharge and redness.   Respiratory: Negative for cough, hemoptysis, sputum production, shortness of breath, wheezing and stridor.   Cardiovascular: Negative for chest pain, palpitations, orthopnea, claudication, leg swelling and PND.  Gastrointestinal: negative for abdominal pain. Negative for heartburn, nausea, vomiting, diarrhea, constipation, blood in stool and melena.  Genitourinary: Negative for dysuria, urgency, frequency, hematuria and flank pain.  Musculoskeletal: Negative for myalgias, back pain, joint pain and falls.  Skin: Negative for itching and rash.  Neurological: Negative for dizziness, tingling, tremors, sensory change, speech change, focal weakness, seizures, loss of consciousness, weakness and headaches.  Endo/Heme/Allergies: Negative for environmental allergies and polydipsia. Does not bruise/bleed easily.  Psychiatric/Behavioral: Negative for depression, suicidal ideas, hallucinations, memory loss and substance abuse. The patient is not nervous/anxious and does not have insomnia.        Objective:  Blood pressure 124/84, pulse 74, height 5\' 6"  (1.676 m), weight 142 lb 8 oz (64.6 kg), last menstrual period 07/14/2020, not currently breastfeeding.   Physical Exam  Vitals reviewed. Constitutional: She is oriented to person, place, and time. She appears well-developed and well-nourished.  HENT:  Head: Normocephalic and atraumatic.        Right Ear: External ear normal.  Left Ear: External ear normal.  Nose: Nose normal.  Mouth/Throat: Oropharynx is clear and moist.  Eyes: Conjunctivae and EOM are normal. Pupils are equal, round, and reactive to light. Right eye exhibits no discharge. Left eye exhibits no discharge. No scleral icterus.  Neck: Normal range of motion. Neck supple. No tracheal deviation present. No thyromegaly present.  Cardiovascular: Normal rate, regular rhythm, normal heart sounds and intact distal pulses.  Exam reveals no gallop and no friction rub.   No murmur  heard. Respiratory: Effort normal and breath sounds normal. No respiratory distress. She has no wheezes. She has no rales. She exhibits no tenderness.  GI: Soft. Bowel sounds are normal. She exhibits no distension and no mass. There is no tenderness. There is no rebound and no guarding.  Genitourinary:  Breasts no masses skin changes or nipple changes bilaterally      Vulva is normal without lesions Vagina is pink moist without discharge Cervix normal in appearance and pap is done Uterus is normal size shape and contour Adnexa is negative with normal sized ovaries   Musculoskeletal: Normal range of motion. She exhibits no edema and no tenderness.  Neurological: She is alert and oriented to person, place, and time. She has normal reflexes. She displays normal reflexes. No cranial nerve deficit. She exhibits normal muscle tone. Coordination normal.  Skin: Skin is warm and dry. No rash noted. No erythema. No pallor.  Psychiatric: She has a normal mood and affect. Her behavior is normal. Judgment and thought content normal.       Medications Ordered at today's visit: Meds ordered this encounter  Medications  . Norgestimate-Ethinyl Estradiol Triphasic (ORTHO TRI-CYCLEN LO) 0.18/0.215/0.25 MG-25 MCG tab    Sig: Take 1 tablet by mouth daily.    Dispense:  84 tablet    Refill:  4    Other orders placed at today's visit: Orders Placed This Encounter  Procedures  . POCT urine pregnancy      Assessment:    Normal Gyn exam.   Pt wants to conver tto [facility] been on norgestimate, triphasic Plan:    Mammogram ordered.     Return in about 3 years (around 07/30/2023) for with Dr Despina Hidden.

## 2020-07-31 LAB — CYTOLOGY - PAP
Comment: NEGATIVE
Diagnosis: NEGATIVE
High risk HPV: NEGATIVE

## 2020-09-27 DIAGNOSIS — U071 COVID-19: Secondary | ICD-10-CM

## 2020-09-27 HISTORY — DX: COVID-19: U07.1

## 2020-10-18 ENCOUNTER — Emergency Department (INDEPENDENT_AMBULATORY_CARE_PROVIDER_SITE_OTHER)
Admission: EM | Admit: 2020-10-18 | Discharge: 2020-10-18 | Disposition: A | Discharge status: Home / Self Care | Payer: Managed Care, Other (non HMO) | Source: Home / Self Care

## 2020-10-18 ENCOUNTER — Other Ambulatory Visit: Payer: Self-pay

## 2020-10-18 DIAGNOSIS — U071 COVID-19: Secondary | ICD-10-CM

## 2020-10-18 DIAGNOSIS — J069 Acute upper respiratory infection, unspecified: Secondary | ICD-10-CM

## 2020-10-18 DIAGNOSIS — B309 Viral conjunctivitis, unspecified: Secondary | ICD-10-CM | POA: Diagnosis not present

## 2020-10-18 LAB — POCT CBC W AUTO DIFF (K'VILLE URGENT CARE)

## 2020-10-18 MED ORDER — OLOPATADINE HCL 0.1 % OP SOLN: 1.0000 [drp] | Freq: Two times a day (BID) | OPHTHALMIC | 0 refills | Status: DC

## 2020-10-18 NOTE — ED Triage Notes (Signed)
Patient presents to Urgent Care with complaints of right eye redness and feeling like something was in it since yesterday morning. Patient reports it seems to be a little better today but seems to be starting to spread to the left eye. States she googled it and it sounds like multisystem inflammatory syndrome and conjunctivitis is one of the signs. Pt denies vision changes.  Pt is also covid positive, has been vaccinated, symptoms started about 10 days ago, tested positive 5 days ago.

## 2020-10-18 NOTE — Discharge Instructions (Addendum)
  You may use the eye drops to help with eye irritation. Please call to schedule a follow up appointment with your primary care provider next week for recheck of symptoms. You may take 500mg  acetaminophen every 4-6 hours or in combination with ibuprofen 400-600mg  every 6-8 hours as needed for pain, inflammation, and fever.  Be sure to well hydrated with clear liquids and get at least 8 hours of sleep at night, preferably more while sick.   You should be notified in about 24-48 hours of abnormal labs but you will also be able to access your lab results as soon as they become available on your [facility] MyChart account.  You may be able to view the labs before someone gets a chance to call you back. You may call our office if you have questions/concerns.  [facility] is open 8am-4pm on Saturday and Sunday, and 8a-8p Monday through Friday.   Call 911 or have someone drive you to the hospital if symptoms significantly worsening- change in vision, severe headache, dizziness/passing out, chest pain, trouble breathing, vomiting/unable to keep down fluids, or other new concerning symptoms develop.

## 2020-10-18 NOTE — ED Provider Notes (Signed)
[name]    CSN: [id number] Arrival date & time: 10/18/20  1757      History   Chief Complaint Chief Complaint  Patient presents with  . Eye Problem    HPI Yolanda Williams is a 39 y.o. female.   HPI Yolanda Williams is a 39 y.o. female presenting to UC with c/o Right eye redness, itching, irritation and small amount of crusty discharge since yesterday.  She is starting to have some irritation in her Left eye.  Pt tested positive at [facility] for [facility] 5 days ago. She has had cough, congestion, fever Tmax 102*F, chills, all managed by OTC NSAIDs.  Pt fully vaccinated with Pfizer COVID vaccine May 2021.  Her husband, who was unvaccinated, tested positive for COVID at the beginning of the month. Their youngest child and now her.  Pt states her youngest was seen by pediatrician yesterday and is doing well. Her husband recovered from his symptoms quickly at home.  Pt's main concern today is MIS-C. She understands this occurs primarily in children but is concerned each day she has developed a new symptom including loss of taste the other day and now eye symptoms since yesterday.  Denies HA, dizziness, change in vision, chest pain or SOB. No rashes. She wears glasses, never contacts. No injury to her eyes.    Past Medical History:  Diagnosis Date  . Anemia   . Hx of syncope   . IUGR (intrauterine growth restriction) 09/10/2015  . Shortened PR interval 2017   seen cardiology, no meds needed.     Patient Active Problem List   Diagnosis Date Noted  . Skin lesion 09/08/2019  . CAP (community acquired pneumonia) 12/06/2018  . Indication for care in labor or delivery 12/04/2018  . Amenorrhea 12/08/2017  . 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 tear duct surgery      OB History    Gravida  2   Para  2   Term  2   Preterm      AB      Living  2     SAB      TAB      Ectopic      Multiple  0     Live Births  2            Home Medications    Prior to Admission medications   Medication Sig Start Date End Date Taking? Authorizing Provider  albuterol (PROVENTIL HFA;VENTOLIN HFA) 108 (90 Base) MCG/ACT inhaler Inhale 2 puffs into the lungs every 6 (six) hours as needed for wheezing or shortness of breath. 12/08/18   [name], MD  INCASSIA 0.35 MG tablet TAKE 1 TABLET (0.35 MG TOTAL) BY MOUTH DAILY. 12/31/19   [name], MD  Norgestimate-Ethinyl Estradiol Triphasic (ORTHO TRI-CYCLEN LO) 0.18/0.215/0.25 MG-25 MCG tab Take 1 tablet by mouth daily. 07/29/20   [name], MD  olopatadine (PATANOL) 0.1 % ophthalmic solution Place 1 drop into both eyes 2 (two) times daily. 10/18/20   Lurene Shadow, PA-C  Prenatal Vit-Fe Fumarate-FA (PRENATAL VITAMIN PLUS LOW IRON) 27-1 MG TABS TAKE 1 TABLET BY MOUTH EVERY DAY 01/08/20   [name], MD    Family History Family History  Problem Relation Age of Onset  . Hypertension Mother   . Miscarriages / [place] Mother   . Valvular heart disease Brother  partly fused "cuspid" valves  . Diabetes Other        paternal side of family    Social History Social History   Tobacco Use  . Smoking status: Never Smoker  . Smokeless tobacco: Never Used  Vaping Use  . Vaping Use: Never used  Substance Use Topics  . Alcohol use: No    Comment: social   . Drug use: No     Allergies   Patient has no known allergies.   Review of Systems Review of Systems  Constitutional: Positive for fever. Negative for chills and diaphoresis.  HENT: Positive for congestion. Negative for ear pain, sore throat, trouble swallowing and voice change.   Eyes: Positive for discharge, redness and itching. Negative for photophobia, pain and visual disturbance.  Respiratory: Positive for cough. Negative for chest tightness and shortness of breath.   Cardiovascular: Negative for chest pain and palpitations.  Gastrointestinal: Negative for  abdominal distention, abdominal pain, diarrhea, nausea and vomiting.  Genitourinary: Negative for dysuria, flank pain and hematuria.  Musculoskeletal: Negative for arthralgias, back pain and myalgias.  Skin: Negative for rash.  Neurological: Negative for dizziness, weakness, light-headedness and headaches.  All other systems reviewed and are negative.    Physical Exam Triage Vital Signs ED Triage Vitals  Enc Vitals Group     BP 10/18/20 1813 119/79     Pulse Rate 10/18/20 1813 90     Resp 10/18/20 1813 16     Temp 10/18/20 1813 98.3 F (36.8 C)     Temp Source 10/18/20 1813 Oral     SpO2 10/18/20 1813 99 %     Weight --      Height --      Head Circumference --      Peak Flow --      Pain Score 10/18/20 1812 0     Pain Loc --      Pain Edu? --      Excl. in GC? --    No data found.  Updated Vital Signs BP 119/79 (BP Location: Left Arm)   Pulse 90   Temp 98.3 F (36.8 C) (Oral)   Resp 16   SpO2 99%   Visual Acuity Right Eye Distance: 20/20 Left Eye Distance: 20/20 Bilateral Distance: 20/20  Right Eye Near:   Left Eye Near:    Bilateral Near:     Physical Exam Vitals and nursing note reviewed.  Constitutional:      General: She is not in acute distress.    Appearance: Normal appearance. She is well-developed. She is not ill-appearing, toxic-appearing or diaphoretic.  HENT:     Head: Normocephalic and atraumatic.     Right Ear: Tympanic membrane and ear canal normal.     Left Ear: Tympanic membrane and ear canal normal.     Nose: Nose normal.     Right Sinus: No maxillary sinus tenderness or frontal sinus tenderness.     Left Sinus: No maxillary sinus tenderness or frontal sinus tenderness.     Mouth/Throat:     Lips: Pink.     Mouth: Mucous membranes are moist.     Pharynx: Oropharynx is clear. Uvula midline. No pharyngeal swelling, oropharyngeal exudate, posterior oropharyngeal erythema or uvula swelling.  Eyes:     General: Lids are normal. Vision  grossly intact.        Right eye: No foreign body or discharge.        Left eye: No foreign body or discharge.  Extraocular Movements: Extraocular movements intact.     Conjunctiva/sclera:     Right eye: Right conjunctiva is injected (slightly more than Left).     Left eye: Left conjunctiva is injected.     Comments: No periorbital erythema, edema, or tenderness.   Cardiovascular:     Rate and Rhythm: Normal rate and regular rhythm.  Pulmonary:     Effort: Pulmonary effort is normal. No respiratory distress.     Breath sounds: Normal breath sounds. No stridor. No wheezing, rhonchi or rales.  Musculoskeletal:        General: Normal range of motion.     Cervical back: Normal range of motion and neck supple. No tenderness.  Lymphadenopathy:     Cervical: No cervical adenopathy.  Skin:    General: Skin is warm and dry.     Findings: No rash.  Neurological:     Mental Status: She is alert and oriented to person, place, and time.  Psychiatric:        Behavior: Behavior normal.      UC Treatments / Results  Labs (all labs ordered are listed, but only abnormal results are displayed) Labs Reviewed  COMPLETE METABOLIC PANEL WITH GFR  SEDIMENTATION RATE  C-REACTIVE PROTEIN  POCT CBC W AUTO DIFF ([facility])    EKG   Radiology No results found.  Procedures Procedures (including critical care time)  Medications Ordered in UC Medications - No data to display  Initial Impression / Assessment and Plan / UC Course  I have reviewed the triage vital signs and the nursing notes.  Pertinent labs & imaging results that were available during my care of the patient were reviewed by me and considered in my medical decision making (see chart for details).     Vitals: WNL including O2 Sat 99% on RA Pt appears well, NAD Eye exam c/w viral conjunctivitis Lungs: CTAB Encouraged symptomatic tx CBC: unremarkable CMP, Sed Rate and CRP pending Encouraged close f/u with PCP  next week for recheck of symptoms Discussed symptoms that warrant emergent care in the ED.  Final Clinical Impressions(s) / UC Diagnoses   Final diagnoses:  COVID-19  Viral conjunctivitis of both eyes  Viral upper respiratory tract infection with cough     Discharge Instructions      You may use the eye drops to help with eye irritation. Please call to schedule a follow up appointment with your primary care provider next week for recheck of symptoms. You may take 500mg  acetaminophen every 4-6 hours or in combination with ibuprofen 400-600mg  every 6-8 hours as needed for pain, inflammation, and fever.  Be sure to well hydrated with clear liquids and get at least 8 hours of sleep at night, preferably more while sick.   You should be notified in about 24-48 hours of abnormal labs but you will also be able to access your lab results as soon as they become available on your [facility] MyChart account.  You may be able to view the labs before someone gets a chance to call you back. You may call our office if you have questions/concerns.  [facility] is open 8am-4pm on Saturday and Sunday, and 8a-8p Monday through Friday.   Call 911 or have someone drive you to the hospital if symptoms significantly worsening- change in vision, severe headache, dizziness/passing out, chest pain, trouble breathing, vomiting/unable to keep down fluids, or other new concerning symptoms develop.       ED Prescriptions    Medication  Sig Dispense Auth. Provider   olopatadine (PATANOL) 0.1 % ophthalmic solution Place 1 drop into both eyes 2 (two) times daily. 5 mL Lurene ShadowPhelps, Reshma Hoey O, PA-C     PDMP not reviewed this encounter.   Lurene Shadowhelps, Shirlee Whitmire O, [place]PA-C 10/18/20 1927

## 2020-10-19 ENCOUNTER — Encounter: Payer: Self-pay | Admitting: Family Medicine

## 2020-10-19 LAB — COMPLETE METABOLIC PANEL WITH GFR
AG Ratio: 1.4 (calc) (ref 1.0–2.5)
ALT: 5 U/L — ABNORMAL LOW (ref 6–29)
AST: 16 U/L (ref 10–30)
Albumin: 4.2 g/dL (ref 3.6–5.1)
Alkaline phosphatase (APISO): 53 U/L (ref 31–125)
BUN/Creatinine Ratio: 10 (calc) (ref 6–22)
BUN: 6 mg/dL — ABNORMAL LOW (ref 7–25)
CO2: 28 mmol/L (ref 20–32)
Calcium: 9 mg/dL (ref 8.6–10.2)
Chloride: 102 mmol/L (ref 98–110)
Creat: 0.61 mg/dL (ref 0.50–1.10)
GFR, Est African American: 131 mL/min/{1.73_m2} (ref >=60)
GFR, Est Non African American: 113 mL/min/{1.73_m2} (ref >=60)
Globulin: 2.9 g/dL (calc) (ref 1.9–3.7)
Glucose, Bld: 83 mg/dL (ref 65–99)
Potassium: 4.1 mmol/L (ref 3.5–5.3)
Sodium: 138 mmol/L (ref 135–146)
Total Bilirubin: 0.3 mg/dL (ref 0.2–1.2)
Total Protein: 7.1 g/dL (ref 6.1–8.1)

## 2020-10-19 LAB — SEDIMENTATION RATE: Sed Rate: 19 mm/h (ref 0–20)

## 2020-10-19 LAB — C-REACTIVE PROTEIN: CRP: 44.8 mg/L — ABNORMAL HIGH (ref <8.0)

## 2020-10-21 NOTE — Telephone Encounter (Signed)
And answered to patient's MyChart question: I am sorry to hear she is Covid positive with illness. CRP elevation is a common finding with COVID-19. In regards to her question on what symptoms to monitor for-worsening fever and shortness of breath would be features that would cause her to need to seek treatment.  Most individuals affected with COVID-19 will be able to recover at home and symptoms can include conjunctivitis, ear infections, upper respiratory infection, rash, nausea, lightheadedness, headache, sinusitis,  etc.  If fever, worsening cough and shortness of breath occurs that would be concerning for onset of pneumonia from COVID-19.  Lastly, literature states that people can also form blood clot/DVT-this is significantly rare and typically with Covid pneumonia cases only (shortness of breath/one-sided leg swelling or tenderness/fever).  Any further concerns or counseling needed, please schedule patient for appointment.

## 2020-10-21 NOTE — Telephone Encounter (Signed)
MyChart message sent to pt

## 2021-01-31 ENCOUNTER — Other Ambulatory Visit: Payer: Self-pay | Admitting: Family Medicine

## 2021-01-31 DIAGNOSIS — Z1231 Encounter for screening mammogram for malignant neoplasm of breast: Secondary | ICD-10-CM

## 2021-03-20 DIAGNOSIS — J029 Acute pharyngitis, unspecified: Secondary | ICD-10-CM

## 2021-03-21 ENCOUNTER — Telehealth (INDEPENDENT_AMBULATORY_CARE_PROVIDER_SITE_OTHER): Payer: Managed Care, Other (non HMO) | Admitting: Family Medicine

## 2021-03-21 ENCOUNTER — Encounter: Payer: Self-pay | Admitting: Family Medicine

## 2021-03-21 VITALS — Ht 66.0 in | Wt 138.0 lb

## 2021-03-21 DIAGNOSIS — J069 Acute upper respiratory infection, unspecified: Secondary | ICD-10-CM | POA: Diagnosis not present

## 2021-03-21 DIAGNOSIS — J029 Acute pharyngitis, unspecified: Secondary | ICD-10-CM | POA: Diagnosis not present

## 2021-03-21 LAB — POCT RAPID STREP A (OFFICE): Rapid Strep A Screen: NEGATIVE

## 2021-03-21 NOTE — Progress Notes (Signed)
Virtual Visit via Video Note  I connected with Yolanda Williams on 03/21/21 at  1:00 PM EDT by a video enabled telemedicine application and verified that I am speaking with the correct person using two identifiers.  Location patient: home, [place] Location provider:work or home office Persons participating in the virtual visit: patient, provider  I discussed the limitations of evaluation and management by telemedicine and the availability of in person appointments. The patient expressed understanding and agreed to proceed.  Telemedicine visit is a necessity given the COVID-19 restrictions in place at the current time.  HPI: 40 y/o WF being seen today for sore throat and upset stomach. Onset 1-2 d/a, slight runny nose, some upset stomach w/out vomiting, +HA, mild achiness and fatigue, this morning started feeling intermittent ST when swallowing.  No fever or cough or rash. Two of her children (2 y/o and 16 y/o) were dx'd with strep throat in the last couple days.   ROS: See pertinent positives and negatives per HPI.  Past Medical History:  Diagnosis Date  . Anemia   . Hx of syncope   . IUGR (intrauterine growth restriction) 09/10/2015  . Shortened PR interval 2017   seen cardiology, no meds needed.     Past Surgical History:  Procedure Laterality Date  . tear duct surgery       Current Outpatient Medications:  .  Norgestimate-Ethinyl Estradiol Triphasic (ORTHO TRI-CYCLEN LO) 0.18/0.215/0.25 MG-25 MCG tab, Take 1 tablet by mouth daily., Disp: 84 tablet, Rfl: 4  EXAM:  VITALS per patient if applicable:  Vitals with BMI 03/21/2021 10/18/2020 07/29/2020  Height 5\' 6"  - 5\' 6"   Weight 138 lbs - 142 lbs 8 oz  BMI 22.28 - 23.01  Systolic - 119 124  Diastolic - 79 84  Pulse - 90 74     GENERAL: alert, oriented, appears well and in no acute distress  HEENT: atraumatic, conjunttiva clear, no obvious abnormalities on inspection of external nose and ears  NECK: normal movements of the head and  neck  LUNGS: on inspection no signs of respiratory distress, breathing rate appears normal, no obvious gross SOB, gasping or wheezing  CV: no obvious cyanosis  MS: moves all visible extremities without noticeable abnormality  PSYCH/NEURO: pleasant and cooperative, no obvious depression or anxiety, speech and thought processing grossly intact  LABS: none today    Chemistry      Component Value Date/Time   NA 138 10/18/2020 1906   K 4.1 10/18/2020 1906   CL 102 10/18/2020 1906   CO2 28 10/18/2020 1906   BUN 6 (L) 10/18/2020 1906   CREATININE 0.61 10/18/2020 1906      Component Value Date/Time   CALCIUM 9.0 10/18/2020 1906   ALKPHOS 81 09/08/2019 1215   AST 16 10/18/2020 1906   ALT 5 (L) 10/18/2020 1906   BILITOT 0.3 10/18/2020 1906       ASSESSMENT AND PLAN:  Discussed the following assessment and plan:  Acute URI, possible strep pharyngitis. She'll come up to office for rapid strep swab to be done this afternoon. No meds rx'd at this time. Recommended ibup or tylenol, rest, fluids.   I discussed the assessment and treatment plan with the patient. The patient was provided an opportunity to ask questions and all were answered. The patient agreed with the plan and demonstrated an understanding of the instructions.   F/u: if not signif improved in 3-4 d  Signed:  10/20/2020, MD  03/21/2021  

## 2021-03-25 ENCOUNTER — Other Ambulatory Visit: Payer: Self-pay

## 2021-03-25 ENCOUNTER — Ambulatory Visit
Admission: RE | Admit: 2021-03-25 | Discharge: 2021-03-25 | Disposition: A | Discharge status: Home / Self Care | Payer: Managed Care, Other (non HMO) | Source: Ambulatory Visit | Attending: Family Medicine | Admitting: Family Medicine

## 2021-03-25 DIAGNOSIS — Z1231 Encounter for screening mammogram for malignant neoplasm of breast: Secondary | ICD-10-CM

## 2021-04-07 ENCOUNTER — Ambulatory Visit (INDEPENDENT_AMBULATORY_CARE_PROVIDER_SITE_OTHER): Payer: Managed Care, Other (non HMO) | Admitting: Family Medicine

## 2021-04-07 ENCOUNTER — Encounter: Payer: Self-pay | Admitting: Family Medicine

## 2021-04-07 ENCOUNTER — Other Ambulatory Visit: Payer: Self-pay

## 2021-04-07 VITALS — BP 94/62 | HR 78 | Temp 97.6°F | Ht 66.0 in | Wt 137.0 lb

## 2021-04-07 DIAGNOSIS — Z130 Encounter for screening for diseases of the blood and blood-forming organs and certain disorders involving the immune mechanism: Secondary | ICD-10-CM

## 2021-04-07 DIAGNOSIS — E611 Iron deficiency: Secondary | ICD-10-CM

## 2021-04-07 DIAGNOSIS — Z131 Encounter for screening for diabetes mellitus: Secondary | ICD-10-CM

## 2021-04-07 DIAGNOSIS — Z1159 Encounter for screening for other viral diseases: Secondary | ICD-10-CM

## 2021-04-07 DIAGNOSIS — Z0000 Encounter for general adult medical examination without abnormal findings: Secondary | ICD-10-CM | POA: Diagnosis not present

## 2021-04-07 DIAGNOSIS — Z793 Long term (current) use of hormonal contraceptives: Secondary | ICD-10-CM | POA: Diagnosis not present

## 2021-04-07 LAB — LIPID PANEL
Cholesterol: 144 mg/dL (ref 0–200)
HDL: 55.8 mg/dL (ref >39.00)
LDL Cholesterol: 68 mg/dL (ref 0–99)
NonHDL: 88.04
Total CHOL/HDL Ratio: 3
Triglycerides: 98 mg/dL (ref 0.0–149.0)
VLDL: 19.6 mg/dL (ref 0.0–40.0)

## 2021-04-07 LAB — CBC WITH DIFFERENTIAL/PLATELET
Basophils Absolute: 0 10*3/uL (ref 0.0–0.1)
Basophils Relative: 0.8 % (ref 0.0–3.0)
Eosinophils Absolute: 0.1 10*3/uL (ref 0.0–0.7)
Eosinophils Relative: 2.1 % (ref 0.0–5.0)
HCT: 38 % (ref 36.0–46.0)
Hemoglobin: 12.7 g/dL (ref 12.0–15.0)
Lymphocytes Relative: 28.3 % (ref 12.0–46.0)
Lymphs Abs: 1.6 10*3/uL (ref 0.7–4.0)
MCHC: 33.4 g/dL (ref 30.0–36.0)
MCV: 92.5 fl (ref 78.0–100.0)
Monocytes Absolute: 0.3 10*3/uL (ref 0.1–1.0)
Monocytes Relative: 5.3 % (ref 3.0–12.0)
Neutro Abs: 3.6 10*3/uL (ref 1.4–7.7)
Neutrophils Relative %: 63.5 % (ref 43.0–77.0)
Platelets: 176 10*3/uL (ref 150.0–400.0)
RBC: 4.11 Mil/uL (ref 3.87–5.11)
RDW: 12.8 % (ref 11.5–15.5)
WBC: 5.7 10*3/uL (ref 4.0–10.5)

## 2021-04-07 LAB — TSH: TSH: 2.35 u[IU]/mL (ref 0.35–4.50)

## 2021-04-07 LAB — COMPREHENSIVE METABOLIC PANEL
ALT: 6 U/L (ref 0–35)
AST: 13 U/L (ref 0–37)
Albumin: 4 g/dL (ref 3.5–5.2)
Alkaline Phosphatase: 51 U/L (ref 39–117)
BUN: 12 mg/dL (ref 6–23)
CO2: 30 mEq/L (ref 19–32)
Calcium: 9 mg/dL (ref 8.4–10.5)
Chloride: 101 mEq/L (ref 96–112)
Creatinine, Ser: 0.66 mg/dL (ref 0.40–1.20)
GFR: 109.42 mL/min (ref >60.00)
Glucose, Bld: 71 mg/dL (ref 70–99)
Potassium: 4 mEq/L (ref 3.5–5.1)
Sodium: 137 mEq/L (ref 135–145)
Total Bilirubin: 0.5 mg/dL (ref 0.2–1.2)
Total Protein: 6.6 g/dL (ref 6.0–8.3)

## 2021-04-07 LAB — HEMOGLOBIN A1C: Hgb A1c MFr Bld: 5.4 % (ref 4.6–6.5)

## 2021-04-07 NOTE — Progress Notes (Signed)
This visit occurred during the SARS-CoV-2 public health emergency.  Safety protocols were in place, including screening questions prior to the visit, additional usage of staff PPE, and extensive cleaning of exam room while observing appropriate contact time as indicated for disinfecting solutions.    Patient ID: Yolanda Williams, female  DOB: [birth year], 40 y.o.   MRN: [id number] Patient Care Team    Relationship Specialty Notifications Start End  Natalia Leatherwood, DO PCP - General Family Medicine  12/08/17   Sherian Rein, MD Consulting Physician Obstetrics and Gynecology  11/16/17   Chrystie Nose, MD Consulting Physician Cardiology  11/16/17     Chief Complaint  Patient presents with  . Annual Exam    Pt is fasting; has question for hep c screening  . Contraception    Would like to discuss bc options; does has OBGYN    Subjective:  [name] is a 40 y.o.  Female  present for CPE. All past medical history, surgical history, allergies, family history, immunizations, medications and social history were updated in the electronic medical record today. All recent labs, ED visits and hospitalizations within the last year were reviewed.  Health maintenance:  Colonoscopy:no fhx, screen at 44  Mammogram:No fhx. Screen at 39. Patient's last menstrual period was 02/26/2021 (lmp unknown). Cervical cancer screening: last pap:2021, results:pt reports normal, completed by:Dr. Despina Hidden Immunizations: tdap2019 UTD, Influenza 2022 UTD, covid completed.  Infectious disease screening: HIVcompleted DEXA:routine screen Assistive device: none Oxygen DZH:GDJM Patient has a Dental home. Hospitalizations/ED visits: reviewed  Depression screen [facility] 2/9 04/07/2021 07/29/2020 09/08/2019 11/16/2017  Decreased Interest 0 0 0 0  Down, Depressed, Hopeless 0 0 0 0  PHQ - 2 Score 0 0 0 0  Altered sleeping - 0 - -  Tired, decreased energy - 0 - -  Change in appetite - 0 - -  Feeling bad or  failure about yourself  - 0 - -  Trouble concentrating - 0 - -  Moving slowly or fidgety/restless - 0 - -  Suicidal thoughts - 0 - -  PHQ-9 Score - 0 - -   GAD 7 : Generalized Anxiety Score 07/29/2020  Nervous, Anxious, on Edge 0  Control/stop worrying 0  Worry too much - different things 0  Trouble relaxing 0  Restless 0  Easily annoyed or irritable 0  Afraid - awful might happen 0  Total GAD 7 Score 0    Immunization History  Administered Date(s) Administered  . Influenza Split 09/27/2012, 08/13/2015, 10/09/2017  . Influenza,inj,Quad PF,6+ Mos 09/02/2018, 09/08/2019, 01/07/2021  . Influenza-Unspecified 10/05/2011, 10/13/2012, 10/11/2016  . PFIZER(Purple Top)SARS-COV-2 Vaccination 05/01/2020, 05/22/2020  . Tdap 06/27/2015, 09/23/2018    Past Medical History:  Diagnosis Date  . Anemia   . CAP (community acquired pneumonia) 12/06/2018  . Hx of syncope   . IUGR (intrauterine growth restriction) 09/10/2015  . Shortened PR interval 2017   seen cardiology, no meds needed.    No Known Allergies Past Surgical History:  Procedure Laterality Date  . tear duct surgery     Family History  Problem Relation Age of Onset  . Hypertension Mother   . Miscarriages / [place] Mother   . Valvular heart disease Brother        partly fused "cuspid" valves  . Diabetes Other        paternal side of family   Social History   Social History Narrative   Married.    Master's Degree. 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  Takes a daily vitamin.    Smoke alarm in the home. Wears her seatbelt.    Feels safe in her relationships.     Allergies as of 04/07/2021   No Known Allergies     Medication List       Accurate as of April 07, 2021  9:11 AM. If you have any questions, ask your nurse or doctor.        Norgestimate-Ethinyl Estradiol Triphasic 0.18/0.215/0.25 MG-25 MCG tab Commonly known as: Ortho Tri-Cyclen Lo Take 1 tablet by mouth daily.       All past medical history,  surgical history, allergies, family history, immunizations andmedications were updated in the EMR today and reviewed under the history and medication portions of their EMR.     Recent Results (from the past 2160 hour(s))  POCT rapid strep A     Status: Normal   Collection Time: 03/21/21  2:31 PM  Result Value Ref Range   Rapid Strep A Screen Negative Negative    MM 3D SCREEN BREAST BILATERAL  Result Date: 03/26/2021 CLINICAL DATA:  Screening. EXAM: DIGITAL SCREENING BILATERAL MAMMOGRAM WITH TOMOSYNTHESIS AND CAD TECHNIQUE: Bilateral screening digital craniocaudal and mediolateral oblique mammograms were obtained. Bilateral screening digital breast tomosynthesis was performed. The images were evaluated with computer-aided detection. COMPARISON:  None. ACR Breast Density Category d: The breast tissue is extremely dense, which lowers the sensitivity of mammography. FINDINGS: There are no findings suspicious for malignancy. The images were evaluated with computer-aided detection. IMPRESSION: No mammographic evidence of malignancy. A result letter of this screening mammogram will be mailed directly to the patient. RECOMMENDATION: Screening mammogram in one year. (Code:SM-B-01Y) BI-RADS CATEGORY  1: Negative. Electronically Signed   By: Edwin Cap M.D.   On: 03/26/2021 12:33     ROS: 14 pt review of systems performed and negative (unless mentioned in an HPI)  Objective: BP 94/62   Pulse 78   Temp 97.6 F (36.4 C) (Oral)   Ht 5\' 6"  (1.676 m)   Wt 137 lb (62.1 kg)   LMP 02/26/2021 (LMP Unknown)   SpO2 99%   BMI 22.11 kg/m  Gen: Afebrile. No acute distress. Nontoxic in appearance, well-developed, well-nourished,  Pleasant female.  HENT: AT. [place]. Bilateral TM visualized and normal in appearance, normal external auditory canal. MMM, no oral lesions, adequate dentition. Bilateral nares within normal limits. Throat without erythema, ulcerations or exudates. no Cough on exam, no hoarseness on  exam. Eyes:Pupils Equal Round Reactive to light, Extraocular movements intact,  Conjunctiva without redness, discharge or icterus. Neck/lymp/endocrine: Supple,no lymphadenopathy, no thyromegaly CV: RRR no murmur, no edema, +2/4 P posterior tibialis pulses.  Chest: CTAB, no wheeze, rhonchi or crackles. normal Respiratory effort. normal Air movement. Abd: Soft. flat. NTND. BS present. no Masses palpated. No hepatosplenomegaly. No rebound tenderness or guarding. Skin: no rashes, purpura or petechiae. Warm and well-perfused. Skin intact. Neuro/Msk: Normal gait. PERLA. EOMi. Alert. Oriented x3.  Cranial nerves II through XII intact. Muscle strength 5/5 upper/lower extremity. DTRs equal bilaterally. Psych: Normal affect, dress and demeanor. Normal speech. Normal thought content and judgment.   No exam data present  Assessment/plan: LEITHA HYPPOLITE is a 40 y.o. female present for CPE  Long term current use of hormonal contraceptive Discussed different types of BC today with her. Encouraged her to look into the Mirena IUD and discuss with her GYN> info provided to her today.  - Comprehensive metabolic panel - Lipid panel - TSH Diabetes mellitus screening - Hemoglobin A1c Screening for  deficiency anemia - CBC with Differential/Platelet Need for hepatitis C screening test - Hepatitis C Antibody Iron deficiency H/o- not taking supplement.  - Iron, TIBC and Ferritin Panel Routine general medical examination at a health care facility Patient was encouraged to exercise greater than 150 minutes a week. Patient was encouraged to choose a diet filled with fresh fruits and vegetables, and lean meats. AVS provided to patient today for education/recommendation on gender specific health and safety maintenance. Colonoscopy:no fhx, screen at 44  Mammogram:No fhx. Screen at 39. Patient's last menstrual period was 02/26/2021 (lmp unknown). Cervical cancer screening: last pap:2021, results:pt reports  normal, completed by:Dr. Despina Hidden Immunizations: tdap2019 UTD, Influenza 2022 UTD, covid completed.  Infectious disease screening: HIVcompleted DEXA:routine screen  Return in about 1 year (around 04/07/2022) for CPE (30 min).   Orders Placed This Encounter  Procedures  . CBC with Differential/Platelet  . Comprehensive metabolic panel  . Hemoglobin A1c  . Lipid panel  . TSH  . Hepatitis C Antibody  . Iron, TIBC and Ferritin Panel   No orders of the defined types were placed in this encounter.  Referral Orders  No referral(s) requested today     Electronically signed by: Felix Pacini, DO [facility] [place]

## 2021-04-07 NOTE — Patient Instructions (Signed)
Look into Mirena IUD.   Levonorgestrel intrauterine device (IUD) What is this medicine? LEVONORGESTREL IUD (LEE voe nor jes trel) is a contraceptive (birth control) device. The device is placed inside the uterus by a health care provider. It is used to prevent pregnancy. Some devices can also be used to treat heavy bleeding that occurs during your period. This medicine may be used for other purposes; ask your health care provider or pharmacist if you have questions. COMMON BRAND NAME(S): Cameron Ali What should I tell my health care provider before I take this medicine? They need to know if you have any of these conditions:  abnormal Pap smear  cancer of the breast, uterus, or cervix  diabetes  endometritis  genital or pelvic infection now or in the past  have more than one sexual partner or your partner has more than one partner  heart disease  history of an ectopic or tubal pregnancy  immune system problems  IUD in place  liver disease or tumor  problems with blood clots or take blood-thinners  seizures  use intravenous drugs  uterus of unusual shape  vaginal bleeding that has not been explained  an unusual or allergic reaction to levonorgestrel, other hormones, silicone, or polyethylene, medicines, foods, dyes, or preservatives  pregnant or trying to get pregnant  breast-feeding How should I use this medicine? This device is placed inside the uterus by a health care professional. Talk to your pediatrician regarding the use of this medicine in children. Special care may be needed. Overdosage: If you think you have taken too much of this medicine contact a poison control center or emergency room at once. NOTE: This medicine is only for you. Do not share this medicine with others. What if I miss a dose? This does not apply. Depending on the brand of device you have inserted, the device will need to be replaced every 3 to 7 years if you wish to  continue using this type of birth control. What may interact with this medicine? Do not take this medicine with any of the following medications:  amprenavir  bosentan  fosamprenavir This medicine may also interact with the following medications:  aprepitant  armodafinil  barbiturate medicines for inducing sleep or treating seizures  bexarotene  boceprevir  griseofulvin  medicines to treat seizures like carbamazepine, ethotoin, felbamate, oxcarbazepine, phenytoin, topiramate  modafinil  pioglitazone  rifabutin  rifampin  rifapentine  some medicines to treat HIV infection like atazanavir, efavirenz, indinavir, lopinavir, nelfinavir, tipranavir, ritonavir  St. John's wort  warfarin This list may not describe all possible interactions. Give your health care provider a list of all the medicines, herbs, non-prescription drugs, or dietary supplements you use. Also tell them if you smoke, drink alcohol, or use illegal drugs. Some items may interact with your medicine. What should I watch for while using this medicine? Visit your doctor or health care professional for regular check ups. See your doctor if you or your partner has sexual contact with others, becomes HIV positive, or gets a sexual transmitted disease. This product does not protect you against HIV infection (AIDS) or other sexually transmitted diseases. You can check the placement of the IUD yourself by reaching up to the top of your vagina with clean fingers to feel the threads. Do not pull on the threads. It is a good habit to check placement after each menstrual period. Call your doctor right away if you feel more of the IUD than just the threads or  if you cannot feel the threads at all. The IUD may come out by itself. You may become pregnant if the device comes out. If you notice that the IUD has come out use a backup birth control method like condoms and call your health care provider. Using tampons will not  change the position of the IUD and are okay to use during your period. This IUD can be safely scanned with magnetic resonance imaging (MRI) only under specific conditions. Before you have an MRI, tell your healthcare provider that you have an IUD in place, and which type of IUD you have in place. What side effects may I notice from receiving this medicine? Side effects that you should report to your doctor or health care professional as soon as possible:  allergic reactions like skin rash, itching or hives, swelling of the face, lips, or tongue  fever, flu-like symptoms  genital sores  high blood pressure  no menstrual period for 6 weeks during use  pain, swelling, warmth in the leg  pelvic pain or tenderness  severe or sudden headache  signs of pregnancy  stomach cramping  sudden shortness of breath  trouble with balance, talking, or walking  unusual vaginal bleeding, discharge  yellowing of the eyes or skin Side effects that usually do not require medical attention (report to your doctor or health care professional if they continue or are bothersome):  acne  breast pain  change in sex drive or performance  changes in weight  cramping, dizziness, or faintness while the device is being inserted  headache  irregular menstrual bleeding within first 3 to 6 months of use  nausea This list may not describe all possible side effects. Call your doctor for medical advice about side effects. You may report side effects to FDA at 1-800-FDA-1088. Where should I keep my medicine? This does not apply. NOTE: This sheet is a summary. It may not cover all possible information. If you have questions about this medicine, talk to your doctor, pharmacist, or health care provider.  2021 Elsevier/Gold Standard (2020-08-13 16:27:45)    Health Maintenance, Female Adopting a healthy lifestyle and getting preventive care are important in promoting health and wellness. Ask your health  care provider about:  The right schedule for you to have regular tests and exams.  Things you can do on your own to prevent diseases and keep yourself healthy. What should I know about diet, weight, and exercise? Eat a healthy diet  Eat a diet that includes plenty of vegetables, fruits, low-fat dairy products, and lean protein.  Do not eat a lot of foods that are high in solid fats, added sugars, or sodium.   Maintain a healthy weight Body mass index (BMI) is used to identify weight problems. It estimates body fat based on height and weight. Your health care provider can help determine your BMI and help you achieve or maintain a healthy weight. Get regular exercise Get regular exercise. This is one of the most important things you can do for your health. Most adults should:  Exercise for at least 150 minutes each week. The exercise should increase your heart rate and make you sweat (moderate-intensity exercise).  Do strengthening exercises at least twice a week. This is in addition to the moderate-intensity exercise.  Spend less time sitting. Even light physical activity can be beneficial. Watch cholesterol and blood lipids Have your blood tested for lipids and cholesterol at 41 years of age, then have this test every 5 years. Have  your cholesterol levels checked more often if:  Your lipid or cholesterol levels are high.  You are older than 41 years of age.  You are at high risk for heart disease. What should I know about cancer screening? Depending on your health history and family history, you may need to have cancer screening at various ages. This may include screening for:  Breast cancer.  Cervical cancer.  Colorectal cancer.  Skin cancer.  Lung cancer. What should I know about heart disease, diabetes, and high blood pressure? Blood pressure and heart disease  High blood pressure causes heart disease and increases the risk of stroke. This is more likely to develop in  people who have high blood pressure readings, are of African descent, or are overweight.  Have your blood pressure checked: ? Every 3-5 years if you are 48-55 years of age. ? Every year if you are 80 years old or older. Diabetes Have regular diabetes screenings. This checks your fasting blood sugar level. Have the screening done:  Once every three years after age 52 if you are at a normal weight and have a low risk for diabetes.  More often and at a younger age if you are overweight or have a high risk for diabetes. What should I know about preventing infection? Hepatitis B If you have a higher risk for hepatitis B, you should be screened for this virus. Talk with your health care provider to find out if you are at risk for hepatitis B infection. Hepatitis C Testing is recommended for:  Everyone born from [birth year] through 1966.  Anyone with known risk factors for hepatitis C. Sexually transmitted infections (STIs)  Get screened for STIs, including gonorrhea and chlamydia, if: ? You are sexually active and are younger than 41 years of age. ? You are older than 41 years of age and your health care provider tells you that you are at risk for this type of infection. ? Your sexual activity has changed since you were last screened, and you are at increased risk for chlamydia or gonorrhea. Ask your health care provider if you are at risk.  Ask your health care provider about whether you are at high risk for HIV. Your health care provider may recommend a prescription medicine to help prevent HIV infection. If you choose to take medicine to prevent HIV, you should first get tested for HIV. You should then be tested every 3 months for as long as you are taking the medicine. Pregnancy  If you are about to stop having your period (premenopausal) and you may become pregnant, seek counseling before you get pregnant.  Take 400 to 800 micrograms (mcg) of folic acid every day if you become  pregnant.  Ask for birth control (contraception) if you want to prevent pregnancy. Osteoporosis and menopause Osteoporosis is a disease in which the bones lose minerals and strength with aging. This can result in bone fractures. If you are 85 years old or older, or if you are at risk for osteoporosis and fractures, ask your health care provider if you should:  Be screened for bone loss.  Take a calcium or vitamin D supplement to lower your risk of fractures.  Be given hormone replacement therapy (HRT) to treat symptoms of menopause. Follow these instructions at home: Lifestyle  Do not use any products that contain nicotine or tobacco, such as cigarettes, e-cigarettes, and chewing tobacco. If you need help quitting, ask your health care provider.  Do not use street drugs.  Do not share needles.  Ask your health care provider for help if you need support or information about quitting drugs. Alcohol use  Do not drink alcohol if: ? Your health care provider tells you not to drink. ? You are pregnant, may be pregnant, or are planning to become pregnant.  If you drink alcohol: ? Limit how much you use to 0-1 drink a day. ? Limit intake if you are breastfeeding.  Be aware of how much alcohol is in your drink. In the [place] one drink equals one 12 oz bottle of beer (355 mL), one 5 oz glass of wine (148 mL), or one 1 oz glass of hard liquor (44 mL). General instructions  Schedule regular health, dental, and eye exams.  Stay current with your vaccines.  Tell your health care provider if: ? You often feel depressed. ? You have ever been abused or do not feel safe at home. Summary  Adopting a healthy lifestyle and getting preventive care are important in promoting health and wellness.  Follow your health care provider's instructions about healthy diet, exercising, and getting tested or screened for diseases.  Follow your health care provider's instructions on monitoring your  cholesterol and blood pressure. This information is not intended to replace advice given to you by your health care provider. Make sure you discuss any questions you have with your health care provider. Document Revised: 12/07/2018 Document Reviewed: 12/07/2018 Elsevier Patient Education  2021 [employer].

## 2021-04-08 LAB — IRON,TIBC AND FERRITIN PANEL
%SAT: 32 % (calc) (ref 16–45)
Ferritin: 55 ng/mL (ref 16–154)
Iron: 116 ug/dL (ref 40–190)
TIBC: 361 mcg/dL (calc) (ref 250–450)

## 2021-04-08 LAB — HEPATITIS C ANTIBODY
Hepatitis C Ab: NONREACTIVE
SIGNAL TO CUT-OFF: 0.01 (ref <1.00)

## 2021-06-03 ENCOUNTER — Ambulatory Visit (INDEPENDENT_AMBULATORY_CARE_PROVIDER_SITE_OTHER): Payer: Managed Care, Other (non HMO) | Admitting: Family Medicine

## 2021-06-03 ENCOUNTER — Other Ambulatory Visit: Payer: Self-pay

## 2021-06-03 ENCOUNTER — Encounter: Payer: Self-pay | Admitting: Family Medicine

## 2021-06-03 VITALS — BP 112/77 | HR 71 | Temp 97.6°F | Ht 66.0 in | Wt 138.0 lb

## 2021-06-03 DIAGNOSIS — M7521 Bicipital tendinitis, right shoulder: Secondary | ICD-10-CM | POA: Diagnosis not present

## 2021-06-03 MED ORDER — METHYLPREDNISOLONE 4 MG PO TBPK: ORAL_TABLET | ORAL | 0 refills | Status: DC

## 2021-06-03 MED ORDER — CYCLOBENZAPRINE HCL 5 MG PO TABS: 5.0000 mg | ORAL_TABLET | Freq: Two times a day (BID) | ORAL | 1 refills | Status: DC | PRN

## 2021-06-03 NOTE — Patient Instructions (Signed)
Start PT in about a week.  Start medrol dose pack and complete.   Flexeril at bed if able to tolerate.   Heat.    Proximal Biceps Tendinitis and Tenosynovitis  The proximal biceps tendon is a strong cord of tissue that connects the biceps muscle on the front of the upper arm to the shoulder blade. Tendinitis is inflammation of a tendon. Tenosynovitis is inflammation of the lining around the tendon (tendon sheath). These conditions often occur at the same time, and they can interfere with the ability to bend the elbow and turn the palm of the hand up. Proximal biceps tendinitis and tenosynovitis are usually caused by overusing the shoulder joint and the biceps muscle. These conditions usually heal within 6 weeks. Proximal biceps tendinitis may include a grade 1 or grade 2 strain of the tendon.  A grade 1 strain is mild, and it involves a slight pull of the tendon without any stretching or noticeable tearing of the tendon. There is usually no loss of biceps muscle strength.  A grade 2 strain is moderate, and it involves a small tear in the tendon. The tendon is stretched, and biceps strength is usually decreased. What are the causes? This condition may be caused by:  A sudden increase in frequency or intensity of activity that involves the shoulder and the biceps muscle.  Overuse of the biceps muscle. This can happen when you do the same movements over and over, such as: ? Turning the palm of the hand up. ? Forceful straightening (hyperextension) of the elbow. ? Bending the elbow.  A direct, forceful hit or injury to the elbow. This is rare. What increases the risk? The following factors may make you more likely to develop this condition:  Playing contact sports.  Playing sports that involve throwing and overhead movements, including racket sports, gymnastics, weight lifting, or bodybuilding.  Doing physical labor.  Having poor strength and flexibility of the arm and shoulder. What  are the signs or symptoms? Symptoms of this condition may include:  Pain and inflammation in the front of the shoulder.  A feeling of warmth in the front of the shoulder.  Limited range of motion of the shoulder and the elbow.  A crackling sound (crepitation) when you move or touch the shoulder or the upper arm. In some cases, symptoms may return after treatment, and they may be long-lasting (chronic). How is this diagnosed? This condition is diagnosed based on:  Your symptoms.  Your medical history.  Physical exam.  X-ray or MRI, if needed. How is this treated? Treatment for this condition depends on the severity of your injury. It may include:  Resting the injured arm.  Icing the injured area.  Doing physical therapy. Your health care provider may also use:  Medicines to treat pain and inflammation.  Sound waves to treat the injured muscle (ultrasound therapy).  Medicines that are injected to the muscle (corticosteroids).  Medicines that numb the area (local anesthetics).  Surgery. This is done if other treatments have not worked. Follow these instructions at home: Managing pain, stiffness, and swelling  If directed, put ice on the injured area. ? Put ice in a plastic bag. ? Place a towel between your skin and the bag. ? Leave the ice on for 20 minutes, 2-3 times a day.  If directed, apply heat to the affected area before you exercise. Use the heat source that your health care provider recommends, such as a moist heat pack or a heating pad. ?  Place a towel between your skin and the heat source. ? Leave the heat on for 20-30 minutes. ? Remove the heat if your skin turns bright red. This is especially important if you are unable to feel pain, heat, or cold. You may have a greater risk of getting burned.  Move your fingers often to reduce stiffness and swelling.  Raise (elevate) the injured area above the level of your heart while you are lying down.       Activity  Do not lift anything that is heavier than 10 lb (4.5 kg), or the limit that you are told, until your health care provider says that it is safe.  Avoid activities that cause pain or make your condition worse.  Return to your normal activities as told by your health care provider. Ask your health care provider what activities are safe for you.  Do exercises as told by your health care provider. General instructions  Take over-the-counter and prescription medicines only as told by your health care provider.  Do not use any products that contain nicotine or tobacco, such as cigarettes, e-cigarettes, and chewing tobacco. These can delay healing. If you need help quitting, ask your health care provider.  Keep all follow-up visits as told by your health care provider. This is important. How is this prevented?  Warm up and stretch before being active.  Cool down and stretch after being active.  Give your body time to rest between periods of activity.  Make sure any equipment that you use is fitted to you.  Be safe and responsible while being active to avoid falls.  Maintain physical fitness, including: ? Strength. ? Flexibility. ? Heart health (cardiovascular fitness). ? The ability to use muscles for a long time (endurance). Contact a health care provider if:  You have symptoms that get worse or do not get better after 2 weeks of treatment.  You develop new symptoms. Get help right away if:  You develop severe pain. Summary  Tendinitis is inflammation of the biceps tendon. Tenosynovitis is inflammation of the lining around the biceps tendon. These conditions often occur at the same time.  These conditions are usually caused by overusing the shoulder joint and biceps muscle.  Symptoms include pain, warmth in the shoulder, and limited range of motion.  The two conditions are treated with rest, ice, medicines, and surgery (rare). This information is not intended to  replace advice given to you by your health care provider. Make sure you discuss any questions you have with your health care provider. Document Revised: 04/11/2019 Document Reviewed: 02/09/2019 Elsevier Patient Education  2021 [employer].

## 2021-06-03 NOTE — Progress Notes (Signed)
This visit occurred during the SARS-CoV-2 public health emergency.  Safety protocols were in place, including screening questions prior to the visit, additional usage of staff PPE, and extensive cleaning of exam room while observing appropriate contact time as indicated for disinfecting solutions.    Yolanda Williams , Sep 03, 1981, 40 y.o., female MRN: [id number] Patient Care Team    Relationship Specialty Notifications Start End  Yolanda Leatherwood, DO PCP - General Family Medicine  12/08/17   Yolanda Rein, MD Consulting Physician Obstetrics and Gynecology  11/16/17   Yolanda Nose, MD Consulting Physician Cardiology  11/16/17     Chief Complaint  Patient presents with  . Shoulder Pain    Pt c/o R shoulder pain radiating down R side of back, pain described as an ache x 1 week; relieved with ice     Subjective: Pt presents for an OV with complaints of right shoulder pain  of 1 week duration.  Associated symptoms include pain with reaching across the body. She states she woke up one day with the pain. She report she will get a tingling feeling sometimes in her upper back between shoulder blades also with in last week. She has had bilateral issues in the past. No surgeries.  She has had a message, used motrin and ice.   Depression screen [facility] 2/9 04/07/2021 07/29/2020 09/08/2019 11/16/2017  Decreased Interest 0 0 0 0  Down, Depressed, Hopeless 0 0 0 0  PHQ - 2 Score 0 0 0 0  Altered sleeping - 0 - -  Tired, decreased energy - 0 - -  Change in appetite - 0 - -  Feeling bad or failure about yourself  - 0 - -  Trouble concentrating - 0 - -  Moving slowly or fidgety/restless - 0 - -  Suicidal thoughts - 0 - -  PHQ-9 Score - 0 - -    No Known Allergies Social History   Social History Narrative   Married.    Master's Degree. 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    Takes a daily vitamin.    Smoke alarm in the home. Wears her seatbelt.    Feels safe in her relationships.    Past  Medical History:  Diagnosis Date  . Anemia   . CAP (community acquired pneumonia) 12/06/2018  . Hx of syncope   . IUGR (intrauterine growth restriction) 09/10/2015  . Shortened PR interval 2017   seen cardiology, no meds needed.    Past Surgical History:  Procedure Laterality Date  . tear duct surgery     Family History  Problem Relation Age of Onset  . Hypertension Mother   . Miscarriages / [place] Mother   . Valvular heart disease Brother        partly fused "cuspid" valves  . Diabetes Other        paternal side of family   Allergies as of 06/03/2021   No Known Allergies     Medication List       Accurate as of June 03, 2021  2:30 PM. If you have any questions, ask your nurse or doctor.        cyclobenzaprine 5 MG tablet Commonly known as: FLEXERIL Take 1 tablet (5 mg total) by mouth 2 (two) times daily as needed for muscle spasms. Started by: Yolanda Pacini, DO   methylPREDNISolone 4 MG Tbpk tablet Commonly known as: MEDROL DOSEPAK Per taper instruction. Started by: Yolanda Pacini, DO   Norgestimate-Ethinyl Estradiol Triphasic 0.18/0.215/0.25  MG-25 MCG tab Commonly known as: Ortho Tri-Cyclen Lo Take 1 tablet by mouth daily.       All past medical history, surgical history, allergies, family history, immunizations andmedications were updated in the EMR today and reviewed under the history and medication portions of their EMR.     ROS: Negative, with the exception of above mentioned in HPI   Objective:  BP 112/77   Pulse 71   Temp 97.6 F (36.4 C) (Oral)   Ht 5\' 6"  (1.676 m)   Wt 138 lb (62.6 kg)   SpO2 99%   BMI 22.27 kg/m  Body mass index is 22.27 kg/m. Gen: Afebrile. No acute distress. Nontoxic in appearance, well developed, well nourished.  HENT: AT. [place].  Eyes:Pupils Equal Round Reactive to light, Extraocular movements intact,  Conjunctiva without redness, discharge or icterus. MSK (right shoulder): no erythema, no soft tissue swelling. FROM with  pain full extension. + empty can test right. Neg hawkins, neg obrien's. TTP proximal bicep tendon. NV intact distally.  No cervical spine pain. TTP and muscle spasm right trap.  Skin: no rashes, purpura or petechiae.  Neuro: Normal gait. PERLA. EOMi. Alert. Oriented x3    No exam data present No results found. No results found for this or any previous visit (from the past 24 hour(s)).  Assessment/Plan: Yolanda Williams is a 40 y.o. female present for OV for  Biceps tendinitis of right upper extremity Rest. Heat.  Medrol dose pack.  Flexeril bid PRN.  - Ambulatory referral to Physical Therapy to start in 1 week.  F/u only needed if pain worsening or not resolved.    Reviewed expectations re: course of current medical issues.  Discussed self-management of symptoms.  Outlined signs and symptoms indicating need for more acute intervention.  Patient verbalized understanding and all questions were answered.  Patient received an After-Visit Summary.    Orders Placed This Encounter  Procedures  . Ambulatory referral to Physical Therapy   Meds ordered this encounter  Medications  . methylPREDNISolone (MEDROL DOSEPAK) 4 MG TBPK tablet    Sig: Per taper instruction.    Dispense:  21 tablet    Refill:  0  . cyclobenzaprine (FLEXERIL) 5 MG tablet    Sig: Take 1 tablet (5 mg total) by mouth 2 (two) times daily as needed for muscle spasms.    Dispense:  30 tablet    Refill:  1    Referral Orders     Ambulatory referral to Physical Therapy   Note is dictated utilizing voice recognition software. Although note has been proof read prior to signing, occasional typographical errors still can be missed. If any questions arise, please do not hesitate to call for verification.   electronically signed by:  40, DO  [facility] [place]

## 2021-08-07 ENCOUNTER — Telehealth: Payer: Self-pay

## 2021-08-07 NOTE — Telephone Encounter (Signed)
Ok for in person visit.

## 2021-08-07 NOTE — Telephone Encounter (Signed)
Tried calling patient, unable to LVM. Please see message below

## 2021-08-07 NOTE — Telephone Encounter (Signed)
Noted  

## 2021-08-07 NOTE — Telephone Encounter (Signed)
Patient called for an appt / specifically requested in office appt because she thinks she may have pneumonia because she is wheezing.  Patient of Dr. Alan Ripper, however provider had no available openings until 8/23.  Patient is currently scheduled with Dr. Milinda Cave tomorrow.  This may change if Dr. Claiborne Billings has any cancellations.  Appt notes:  wheezing / h/o pnuemonia / no other sx  / no fever / lingering cough after all family memebers COVID week of 7/25  Patient is scheduled for an in office appointment.  Please advise if this needs to be changed to virtual visit - sending note to provider for approval  Patient can be reached at [phone] for more information.

## 2021-08-07 NOTE — Telephone Encounter (Signed)
Please advise if still ok for in office appt?

## 2021-08-07 NOTE — Telephone Encounter (Signed)
Patient returned call and I relayed that her appt tomorrow will be in person. Patient voiced understanding.

## 2021-08-08 ENCOUNTER — Ambulatory Visit (HOSPITAL_BASED_OUTPATIENT_CLINIC_OR_DEPARTMENT_OTHER)
Admission: RE | Admit: 2021-08-08 | Discharge: 2021-08-08 | Disposition: A | Discharge status: Home / Self Care | Payer: Managed Care, Other (non HMO) | Source: Ambulatory Visit | Attending: Family Medicine | Admitting: Family Medicine

## 2021-08-08 ENCOUNTER — Telehealth: Payer: Self-pay | Admitting: Family Medicine

## 2021-08-08 ENCOUNTER — Other Ambulatory Visit: Payer: Self-pay

## 2021-08-08 ENCOUNTER — Encounter: Payer: Self-pay | Admitting: Family Medicine

## 2021-08-08 ENCOUNTER — Ambulatory Visit (INDEPENDENT_AMBULATORY_CARE_PROVIDER_SITE_OTHER): Payer: Managed Care, Other (non HMO) | Admitting: Family Medicine

## 2021-08-08 VITALS — BP 121/85 | HR 75 | Temp 97.9°F | Resp 16 | Ht 66.0 in | Wt 140.2 lb

## 2021-08-08 DIAGNOSIS — U071 COVID-19: Secondary | ICD-10-CM

## 2021-08-08 DIAGNOSIS — R059 Cough, unspecified: Secondary | ICD-10-CM

## 2021-08-08 DIAGNOSIS — Z8701 Personal history of pneumonia (recurrent): Secondary | ICD-10-CM

## 2021-08-08 NOTE — Telephone Encounter (Signed)
Spoke with patient regarding results/recommendations,voiced understanding.  

## 2021-08-08 NOTE — Progress Notes (Signed)
OFFICE VISIT  08/08/2021  CC:  Chief Complaint  Patient presents with   Wheezing    X2days; could feel it in her chest.    HPI:    Patient is a 40 y.o. Caucasian female who presents for "wheezing".  Covid illness onset 3 wks ago, sx's were congestion, cough, says sx's mild/brief duration of illness.  No fevers.  No malaise or body aching.  No HA. Lingering cough some and yesterday started to feel/hear wheezing in central chest.  Some off/on slight pain in precordial area of chest when coughing.  No feeling of inadequate aeration or chest tightness. Cough not really productive but hears a rattle sometimes. No otc meds tried---says cough is not frequent enough.  ROS as above, plus--> no dizziness,no rashes, no melena/hematochezia.  No polyuria or polydipsia.  No myalgias or arthralgias.  No focal weakness, paresthesias, or tremors.  No acute vision or hearing abnormalities.  No dysuria or unusual/new urinary urgency or frequency.  No recent changes in lower legs. No n/v/d or abd pain.  No palpitations.    Past Medical History:  Diagnosis Date   Anemia    CAP (community acquired pneumonia) 12/06/2018   COVID-19 virus infection 09/2020   Hx of syncope    IUGR (intrauterine growth restriction) 09/10/2015   Shortened PR interval 2017   seen cardiology, no meds needed.     Past Surgical History:  Procedure Laterality Date   tear duct surgery     Social History   Socioeconomic History   Marital status: Married    Spouse name: Not on file   Number of children: Not on file   Years of education: Not on file   Highest education level: Not on file  Occupational History   Not on file  Tobacco Use   Smoking status: Never   Smokeless tobacco: Never  Vaping Use   Vaping Use: Never used  Substance and Sexual Activity   Alcohol use: No    Comment: social    Drug use: No   Sexual activity: Yes    Partners: Male    Birth control/protection: Pill    Comment: Married  Other  Topics Concern   Not on file  Social History Narrative   Married.    Master's Degree. 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    Takes a daily vitamin.    Smoke alarm in the home. Wears her seatbelt.    Feels safe in her relationships.    Social Determinants of Health   Financial Resource Strain: Not on file  Food Insecurity: Not on file  Transportation Needs: Not on file  Physical Activity: Not on file  Stress: Not on file  Social Connections: Not on file    Outpatient Medications Prior to Visit  Medication Sig Dispense Refill   cyclobenzaprine (FLEXERIL) 5 MG tablet Take 1 tablet (5 mg total) by mouth 2 (two) times daily as needed for muscle spasms. (Patient not taking: Reported on 08/08/2021) 30 tablet 1   methylPREDNISolone (MEDROL DOSEPAK) 4 MG TBPK tablet Per taper instruction. (Patient not taking: Reported on 08/08/2021) 21 tablet 0   Norgestimate-Ethinyl Estradiol Triphasic (ORTHO TRI-CYCLEN LO) 0.18/0.215/0.25 MG-25 MCG tab Take 1 tablet by mouth daily. (Patient not taking: No sig reported) 84 tablet 4   No facility-administered medications prior to visit.    No Known Allergies  ROS As per HPI  PE: Vitals with BMI 08/08/2021 06/03/2021 04/07/2021  Height 5\' 6"  5\' 6"  5\' 6"   Weight 140 lbs 3  oz 138 lbs 137 lbs  BMI 22.64 22.28 22.12  Systolic 121 112 94  Diastolic 85 77 62  Pulse 75 71 78   Gen: Alert, well appearing.  Patient is oriented to person, place, time, and situation. AFFECT: pleasant, lucid thought and speech. ENT: Ears: EACs clear, normal epithelium.  TMs with good light reflex and landmarks bilaterally.  Eyes: no injection, icteris, swelling, or exudate.  EOMI, PERRLA. Nose: no drainage or turbinate edema/swelling.  No injection or focal lesion.  Mouth: lips without lesion/swelling.  Oral mucosa pink and moist.  Dentition intact and without obvious caries or gingival swelling.  Oropharynx without erythema, exudate, or swelling.  Neck - No masses or thyromegaly or  limitation in range of motion CV: RRR, no m/r/g.   LUNGS: CTA bilat, nonlabored resps, good aeration in all lung fields. EXT: no clubbing or cyanosis.  no edema.    LABS:    Chemistry      Component Value Date/Time   NA 137 04/07/2021 0906   K 4.0 04/07/2021 0906   CL 101 04/07/2021 0906   CO2 30 04/07/2021 0906   BUN 12 04/07/2021 0906   CREATININE 0.66 04/07/2021 0906   CREATININE 0.61 10/18/2020 1906      Component Value Date/Time   CALCIUM 9.0 04/07/2021 0906   ALKPHOS 51 04/07/2021 0906   AST 13 04/07/2021 0906   ALT 6 04/07/2021 0906   BILITOT 0.5 04/07/2021 0906     Lab Results  Component Value Date   WBC 5.7 04/07/2021   HGB 12.7 04/07/2021   HCT 38.0 04/07/2021   MCV 92.5 04/07/2021   PLT 176.0 04/07/2021   Lab Results  Component Value Date   IRON 116 04/07/2021   TIBC 361 04/07/2021   FERRITIN 55 04/07/2021   Lab Results  Component Value Date   TSH 2.35 04/07/2021   Lab Results  Component Value Date   HGBA1C 5.4 04/07/2021   IMPRESSION AND PLAN:  1) Covid infection 2 wks ago, lingering mild cough and some intermittent feeling of wheezing in central chest.  Pt feels same as when she was dx'd with pneumonia 3 years ago. Since she feels well otherwise and has no objective findings to suggest pneumonia, we'll hold off on abx at this time and check a CXR. Pt was part of this decision making process and agreed.  An After Visit Summary was printed and given to the patient.  FOLLOW UP: Return if symptoms worsen or fail to improve.  Signed:  Santiago Bumpers, MD           08/08/2021

## 2021-08-08 NOTE — Telephone Encounter (Signed)
Pls notify pt that I have seen her x-ray images and it looks absolutely normal. No sign of any infection or scarring. The radiologist has not done the official interpretation yet so I'll call her if the interpretation is different from mine.-thx

## 2021-08-10 ENCOUNTER — Other Ambulatory Visit: Payer: Self-pay | Admitting: Family Medicine

## 2021-08-10 ENCOUNTER — Encounter: Payer: Self-pay | Admitting: Family Medicine

## 2021-08-10 DIAGNOSIS — J189 Pneumonia, unspecified organism: Secondary | ICD-10-CM

## 2021-08-10 MED ORDER — AMOXICILLIN 875 MG PO TABS: 875.0000 mg | ORAL_TABLET | Freq: Two times a day (BID) | ORAL | 0 refills | Status: AC

## 2021-08-10 MED ORDER — DOXYCYCLINE HYCLATE 100 MG PO CAPS: 100.0000 mg | ORAL_CAPSULE | Freq: Two times a day (BID) | ORAL | 0 refills | Status: AC

## 2021-08-25 ENCOUNTER — Other Ambulatory Visit: Payer: Self-pay | Admitting: Obstetrics & Gynecology

## 2021-08-25 DIAGNOSIS — N644 Mastodynia: Secondary | ICD-10-CM

## 2021-08-25 NOTE — Telephone Encounter (Signed)
Chest x-ray ordered for [facility]

## 2021-08-27 ENCOUNTER — Ambulatory Visit (HOSPITAL_BASED_OUTPATIENT_CLINIC_OR_DEPARTMENT_OTHER)
Admission: RE | Admit: 2021-08-27 | Discharge: 2021-08-27 | Disposition: A | Discharge status: Home / Self Care | Payer: Managed Care, Other (non HMO) | Source: Ambulatory Visit | Attending: Family Medicine | Admitting: Family Medicine

## 2021-08-27 ENCOUNTER — Other Ambulatory Visit: Payer: Self-pay

## 2021-08-27 DIAGNOSIS — J189 Pneumonia, unspecified organism: Secondary | ICD-10-CM

## 2021-09-04 IMAGING — DX DG CHEST 2V
2 series · 2 of 2 positions shown · non-contrast
Comparison: December 04, 2018

CLINICAL DATA: Cough for 2 weeks.  Pneumonia.

EXAM:
CHEST - 2 VIEW

[chest pa]
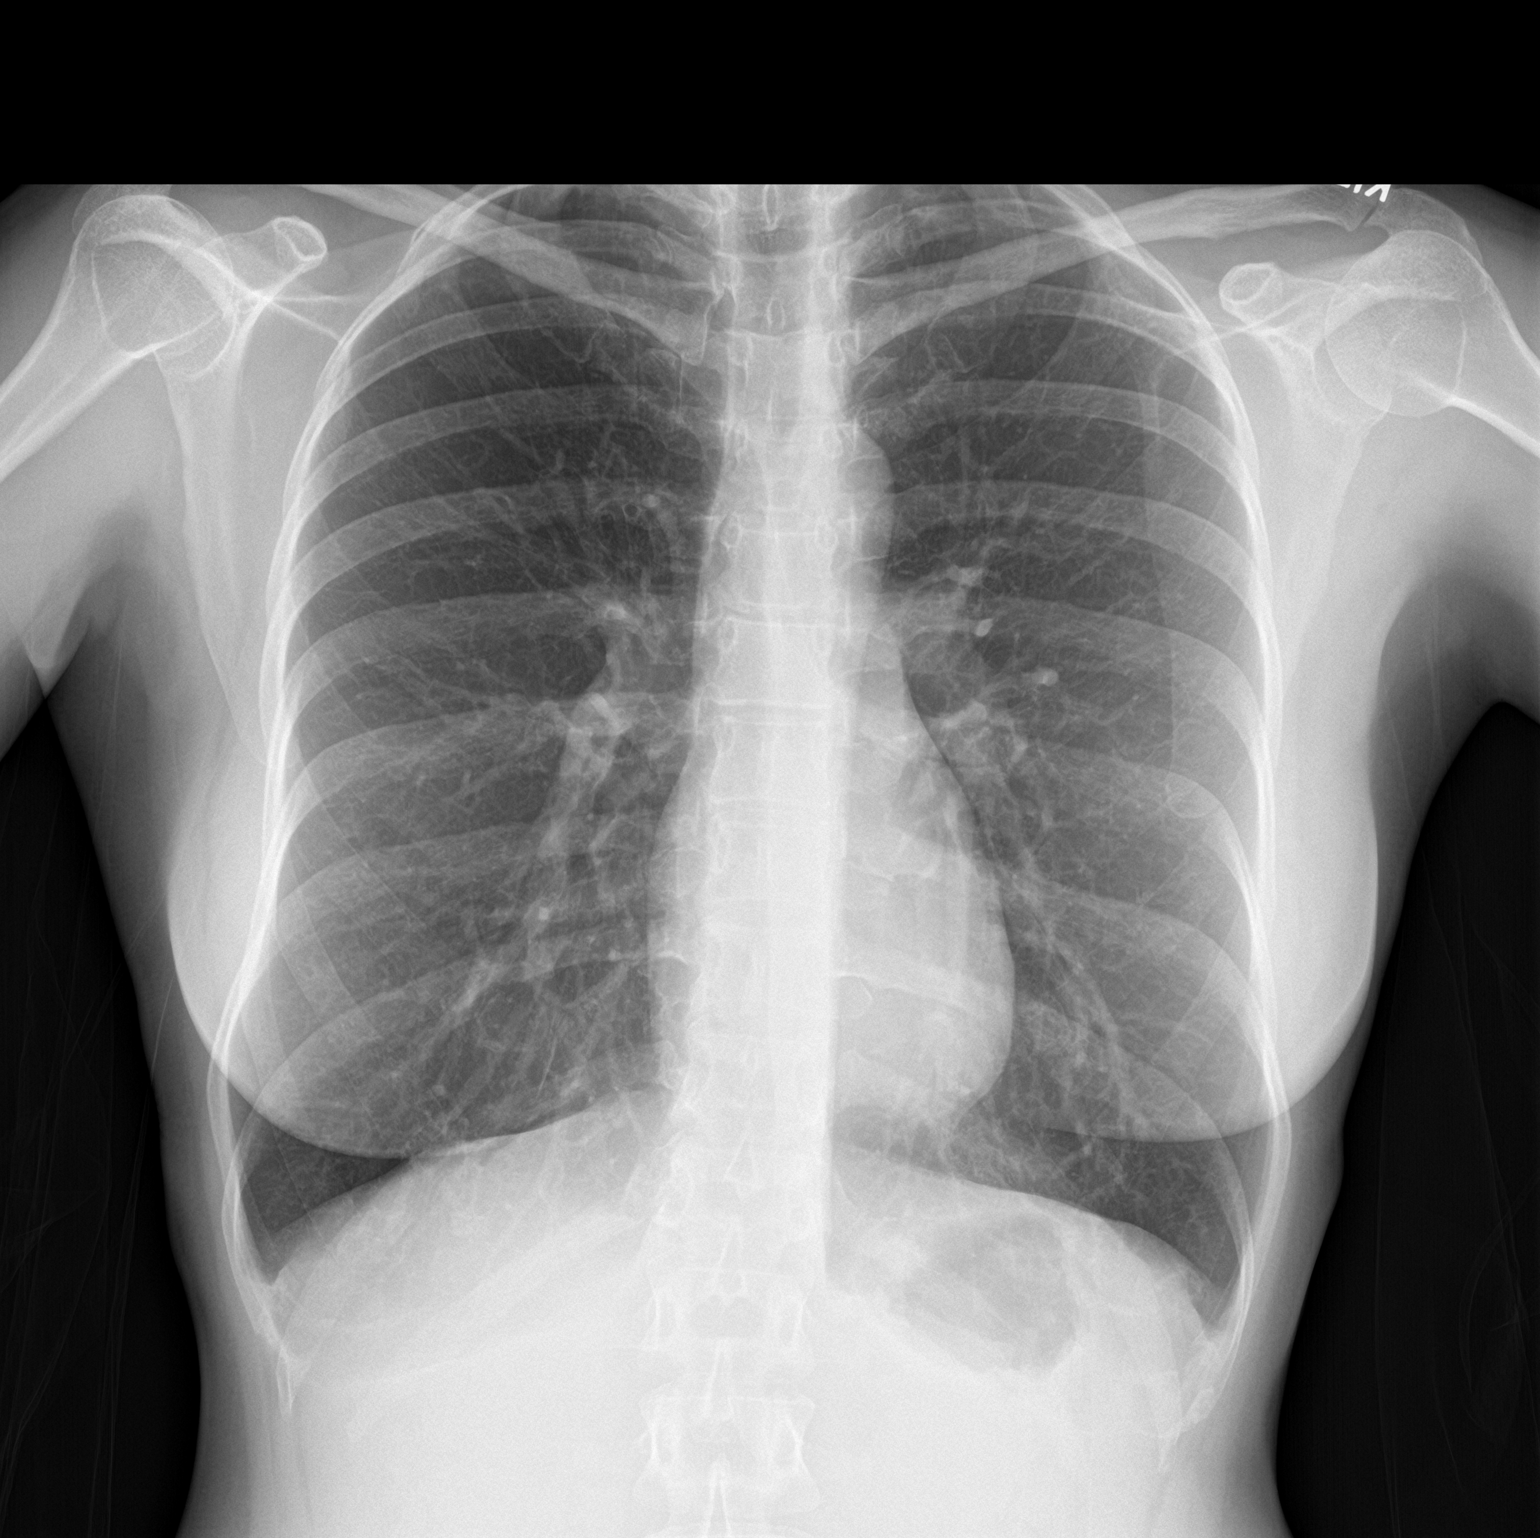

[chest lat]
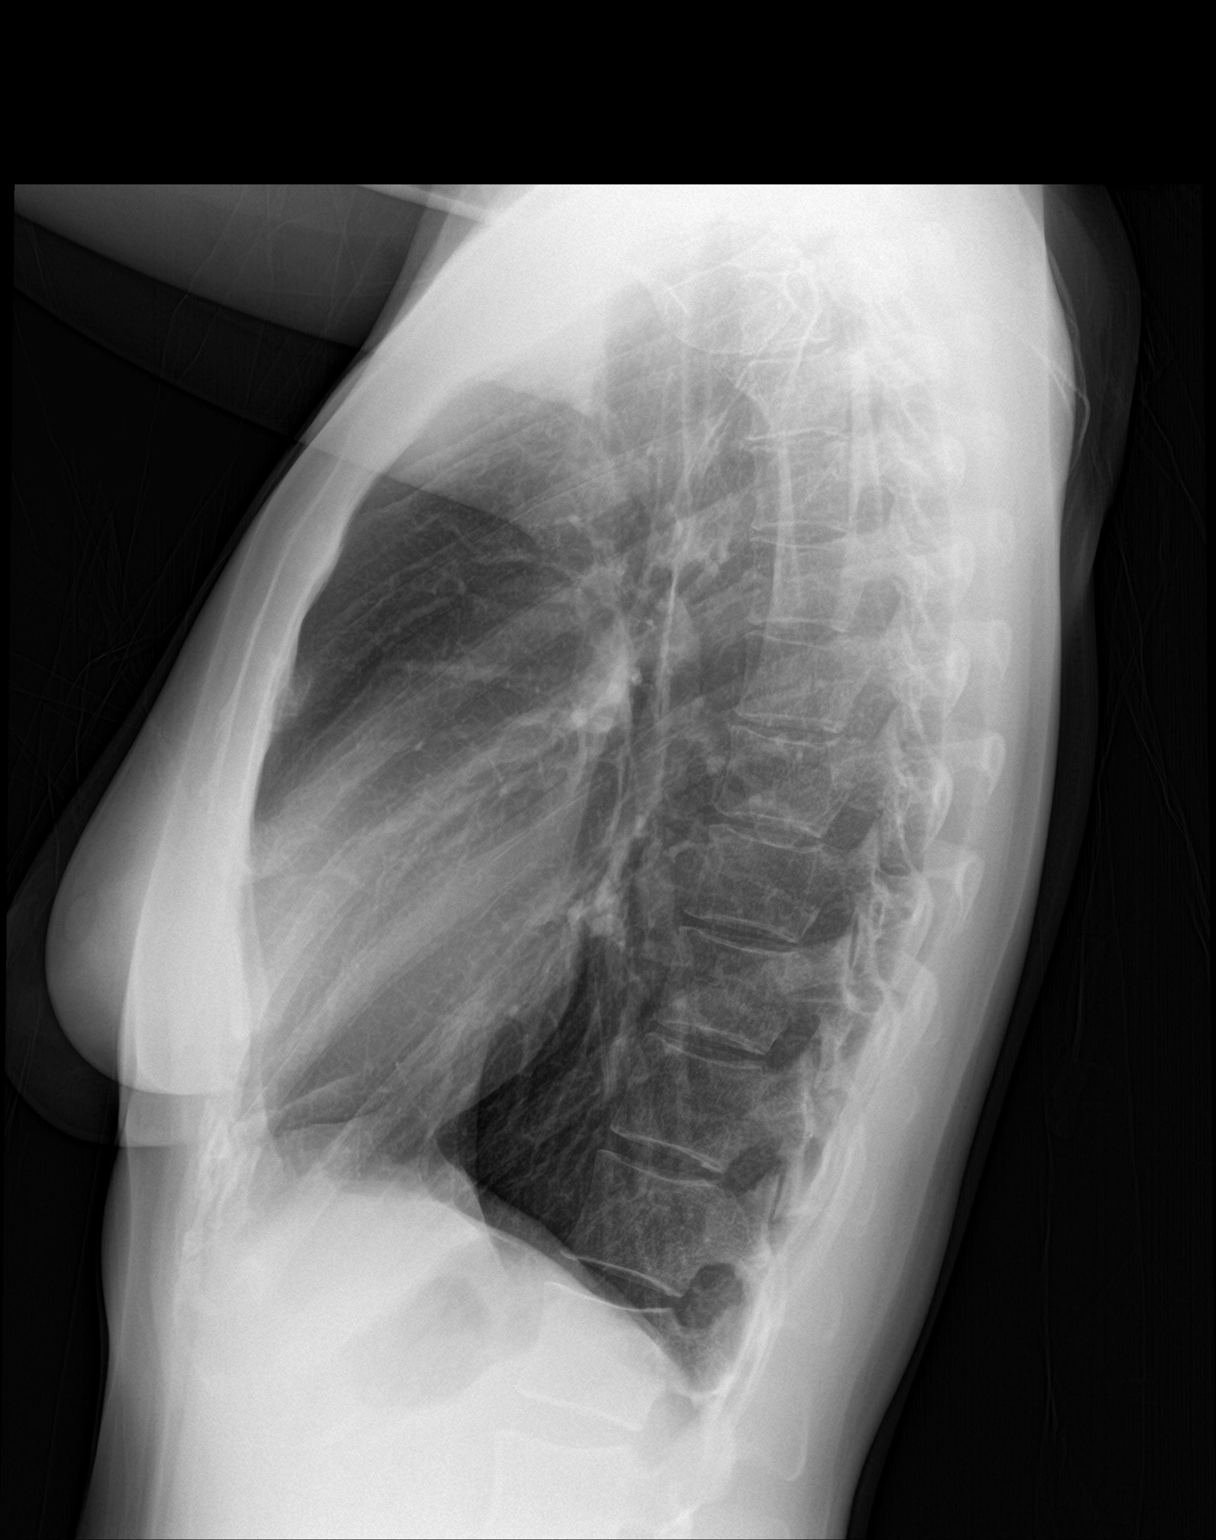

[2 of 2 positions shown; findings below may reference images not displayed]

FINDINGS: Hyperinflation of the lungs. The heart, hila, mediastinum, lungs,
and pleura are otherwise unremarkable. No other acute abnormalities.
IMPRESSION: Hyperinflation of the lungs. In relatively young patient, this could
be due to an exuberant inspiratory effort. Asthma or COPD would have
a similar appearance.

Mild opacity in the left base could represent subtle infiltrate
given history. Recommend short-term follow-up imaging to ensure
resolution.

## 2021-09-19 ENCOUNTER — Encounter: Payer: Self-pay | Admitting: Obstetrics & Gynecology

## 2021-09-19 ENCOUNTER — Other Ambulatory Visit: Payer: Self-pay

## 2021-09-19 ENCOUNTER — Ambulatory Visit (INDEPENDENT_AMBULATORY_CARE_PROVIDER_SITE_OTHER): Payer: Managed Care, Other (non HMO) | Admitting: Obstetrics & Gynecology

## 2021-09-19 VITALS — BP 129/85 | HR 81 | Ht 66.0 in | Wt 140.0 lb

## 2021-09-19 DIAGNOSIS — Z01419 Encounter for gynecological examination (general) (routine) without abnormal findings: Secondary | ICD-10-CM | POA: Diagnosis not present

## 2021-09-19 NOTE — Progress Notes (Signed)
Subjective:     Yolanda Williams is a 40 y.o. female here for a routine exam.  Patient's last menstrual period was 08/31/2021. [id number] Birth Control Method:  none, by choice Menstrual Calendar(currently): regular  Current complaints: none.   Current acute medical issues:  none   Recent Gynecologic History Patient's last menstrual period was 08/31/2021. Last Pap: 08/25/2020,  normal Last mammogram: 3/22,  normal  Past Medical History:  Diagnosis Date   Anemia    CAP (community acquired pneumonia) 12/06/2018   COVID-19 virus infection 09/2020   Hx of syncope    IUGR (intrauterine growth restriction) 09/10/2015   Shortened PR interval 2017   seen cardiology, no meds needed.     Past Surgical History:  Procedure Laterality Date   tear duct surgery      OB History     Gravida  2   Para  2   Term  2   Preterm      AB      Living  2      SAB      IAB      Ectopic      Multiple  0   Live Births  2           Social History   Socioeconomic History   Marital status: Married    Spouse name: Not on file   Number of children: Not on file   Years of education: Not on file   Highest education level: Not on file  Occupational History   Not on file  Tobacco Use   Smoking status: Never   Smokeless tobacco: Never  Vaping Use   Vaping Use: Never used  Substance and Sexual Activity   Alcohol use: No    Comment: social    Drug use: No   Sexual activity: Yes    Partners: Male    Birth control/protection: None    Comment: Married  Other Topics Concern   Not on file  Social History Narrative   Married.    Master's Degree. 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    Takes a daily vitamin.    Smoke alarm in the home. Wears her seatbelt.    Feels safe in her relationships.    Social Determinants of Health   Financial Resource Strain: Low Risk    Difficulty of Paying Living Expenses: Not hard at all  Food Insecurity: No Food Insecurity   Worried About Community education officer in the Last Year: Never true   Ran Out of Food in the Last Year: Never true  Transportation Needs: No Transportation Needs   Lack of Transportation (Medical): No   Lack of Transportation (Non-Medical): No  Physical Activity: Inactive   Days of Exercise per Week: 0 days   Minutes of Exercise per Session: 0 min  Stress: No Stress Concern Present   Feeling of Stress : Only a little  Social Connections: Socially Isolated   Frequency of Communication with Friends and Family: Once a week   Frequency of Social Gatherings with Friends and Family: Once a week   Attends Religious Services: Never   Database administrator or Organizations: No   Attends Engineer, structural: Never   Marital Status: Married    Family History  Problem Relation Age of Onset   Hypertension Mother    Miscarriages / Stillbirths Mother    Valvular heart disease Brother        partly fused "cuspid" valves  Diabetes Other        paternal side of family     Current Outpatient Medications:    Multiple Vitamin (MULTIVITAMIN) tablet, Take 1 tablet by mouth., Disp: , Rfl:   Review of Systems  Review of Systems  Constitutional: Negative for fever, chills, weight loss, malaise/fatigue and diaphoresis.  HENT: Negative for hearing loss, ear pain, nosebleeds, congestion, sore throat, neck pain, tinnitus and ear discharge.   Eyes: Negative for blurred vision, double vision, photophobia, pain, discharge and redness.  Respiratory: Negative for cough, hemoptysis, sputum production, shortness of breath, wheezing and stridor.   Cardiovascular: Negative for chest pain, palpitations, orthopnea, claudication, leg swelling and PND.  Gastrointestinal: negative for abdominal pain. Negative for heartburn, nausea, vomiting, diarrhea, constipation, blood in stool and melena.  Genitourinary: Negative for dysuria, urgency, frequency, hematuria and flank pain.  Musculoskeletal: Negative for myalgias, back pain, joint  pain and falls.  Skin: Negative for itching and rash.  Neurological: Negative for dizziness, tingling, tremors, sensory change, speech change, focal weakness, seizures, loss of consciousness, weakness and headaches.  Endo/Heme/Allergies: Negative for environmental allergies and polydipsia. Does not bruise/bleed easily.  Psychiatric/Behavioral: Negative for depression, suicidal ideas, hallucinations, memory loss and substance abuse. The patient is not nervous/anxious and does not have insomnia.        Objective:  Blood pressure 129/85, pulse 81, height 5\' 6"  (1.676 m), weight 140 lb (63.5 kg), last menstrual period 08/31/2021.   Physical Exam  Vitals reviewed. Constitutional: She is oriented to person, place, and time. She appears well-developed and well-nourished.  HENT:  Head: Normocephalic and atraumatic.        Right Ear: External ear normal.  Left Ear: External ear normal.  Nose: Nose normal.  Mouth/Throat: Oropharynx is clear and moist.  Eyes: Conjunctivae and EOM are normal. Pupils are equal, round, and reactive to light. Right eye exhibits no discharge. Left eye exhibits no discharge. No scleral icterus.  Neck: Normal range of motion. Neck supple. No tracheal deviation present. No thyromegaly present.  Cardiovascular: Normal rate, regular rhythm, normal heart sounds and intact distal pulses.  Exam reveals no gallop and no friction rub.   No murmur heard. Respiratory: Effort normal and breath sounds normal. No respiratory distress. She has no wheezes. She has no rales. She exhibits no tenderness.  GI: Soft. Bowel sounds are normal. She exhibits no distension and no mass. There is no tenderness. There is no rebound and no guarding.  Genitourinary:  Breasts no masses skin changes or nipple changes bilaterally      Vulva is normal without lesions Vagina is pink moist without discharge Cervix normal in appearance and pap is done Uterus is normal size shape and contour Adnexa is  negative with normal sized ovaries   Musculoskeletal: Normal range of motion. She exhibits no edema and no tenderness.  Neurological: She is alert and oriented to person, place, and time. She has normal reflexes. She displays normal reflexes. No cranial nerve deficit. She exhibits normal muscle tone. Coordination normal.  Skin: Skin is warm and dry. No rash noted. No erythema. No pallor.  Psychiatric: She has a normal mood and affect. Her behavior is normal. Judgment and thought content normal.       Medications Ordered at today's visit: No orders of the defined types were placed in this encounter.   Other orders placed at today's visit: No orders of the defined types were placed in this encounter.     Assessment:    Normal Gyn exam.  Plan:    Contraception: none. Follow up in: 2 years.     No follow-ups on file.

## 2021-09-25 ENCOUNTER — Ambulatory Visit: Payer: Managed Care, Other (non HMO)

## 2021-09-25 ENCOUNTER — Ambulatory Visit
Admission: RE | Admit: 2021-09-25 | Discharge: 2021-09-25 | Disposition: A | Discharge status: Home / Self Care | Payer: Managed Care, Other (non HMO) | Source: Ambulatory Visit | Attending: Obstetrics & Gynecology | Admitting: Obstetrics & Gynecology

## 2021-09-25 ENCOUNTER — Other Ambulatory Visit: Payer: Self-pay

## 2021-09-25 DIAGNOSIS — N644 Mastodynia: Secondary | ICD-10-CM

## 2021-10-03 ENCOUNTER — Other Ambulatory Visit: Payer: Managed Care, Other (non HMO)

## 2021-10-13 ENCOUNTER — Encounter: Payer: Self-pay | Admitting: Family Medicine

## 2021-10-13 NOTE — Telephone Encounter (Signed)
Please advise if this is something we do here in office.

## 2021-10-17 ENCOUNTER — Other Ambulatory Visit: Payer: Managed Care, Other (non HMO)

## 2021-10-23 ENCOUNTER — Other Ambulatory Visit: Payer: Self-pay | Admitting: Obstetrics & Gynecology

## 2021-10-27 ENCOUNTER — Other Ambulatory Visit: Payer: Managed Care, Other (non HMO)

## 2021-11-03 ENCOUNTER — Other Ambulatory Visit: Payer: Self-pay | Admitting: Obstetrics & Gynecology

## 2021-11-03 ENCOUNTER — Other Ambulatory Visit: Payer: Self-pay

## 2021-11-03 ENCOUNTER — Ambulatory Visit
Admission: RE | Admit: 2021-11-03 | Discharge: 2021-11-03 | Disposition: A | Discharge status: Home / Self Care | Payer: Managed Care, Other (non HMO) | Source: Ambulatory Visit | Attending: Obstetrics & Gynecology | Admitting: Obstetrics & Gynecology

## 2021-11-03 DIAGNOSIS — R921 Mammographic calcification found on diagnostic imaging of breast: Secondary | ICD-10-CM

## 2021-11-03 DIAGNOSIS — N644 Mastodynia: Secondary | ICD-10-CM

## 2021-11-13 ENCOUNTER — Ambulatory Visit
Admission: RE | Admit: 2021-11-13 | Discharge: 2021-11-13 | Disposition: A | Discharge status: Home / Self Care | Payer: Managed Care, Other (non HMO) | Source: Ambulatory Visit | Attending: Obstetrics & Gynecology | Admitting: Obstetrics & Gynecology

## 2021-11-13 ENCOUNTER — Other Ambulatory Visit: Payer: Self-pay

## 2021-11-13 DIAGNOSIS — R921 Mammographic calcification found on diagnostic imaging of breast: Secondary | ICD-10-CM

## 2021-11-19 ENCOUNTER — Encounter: Payer: Self-pay | Admitting: Obstetrics & Gynecology

## 2021-12-10 IMAGING — MG MM BREAST BX W LOC DEV 1ST LESION IMAGE BX SPEC STEREO GUIDE*L*
8 of 12 series · 8 of 20 positions shown · non-contrast
Comparison: Previous exams.
COMPARISON: Previous exams.

Addendum:
CLINICAL DATA: Stereotactic biopsy for left breast calcifications.

EXAM:
LEFT BREAST STEREOTACTIC CORE NEEDLE BIOPSY

[L (1 of 7)]
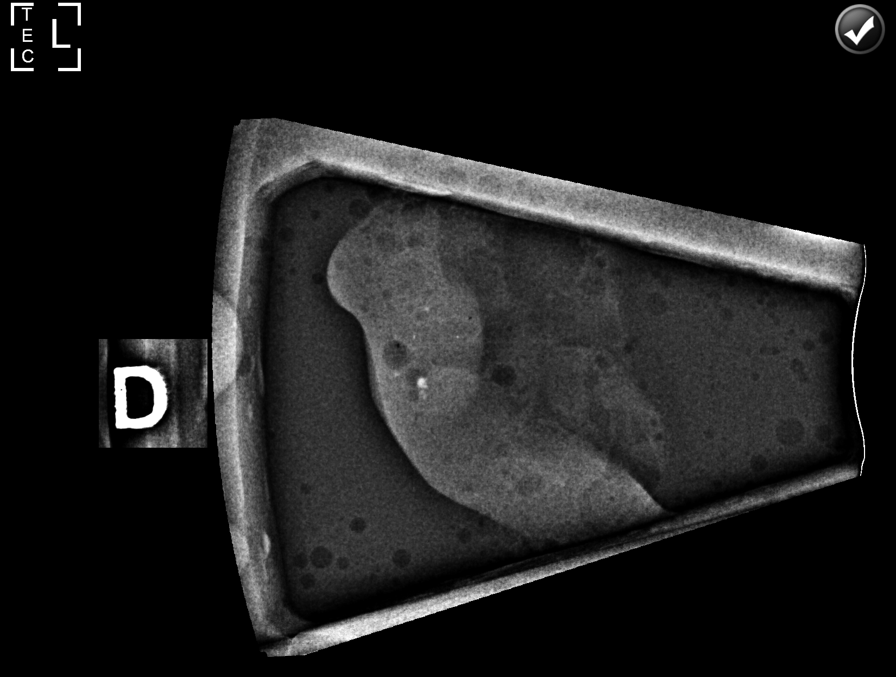

[L (2 of 7)]
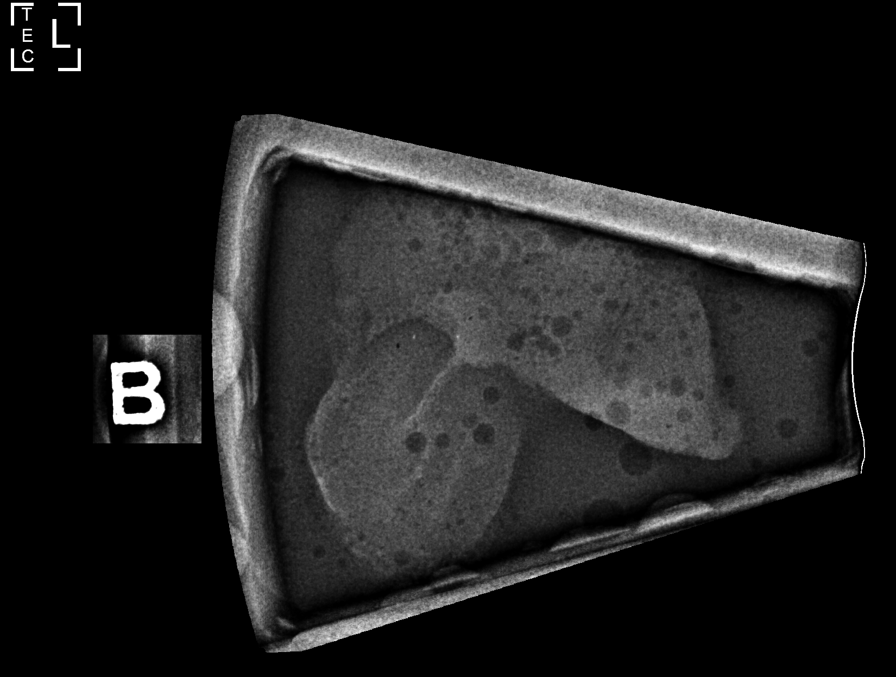

[L (3 of 7)]
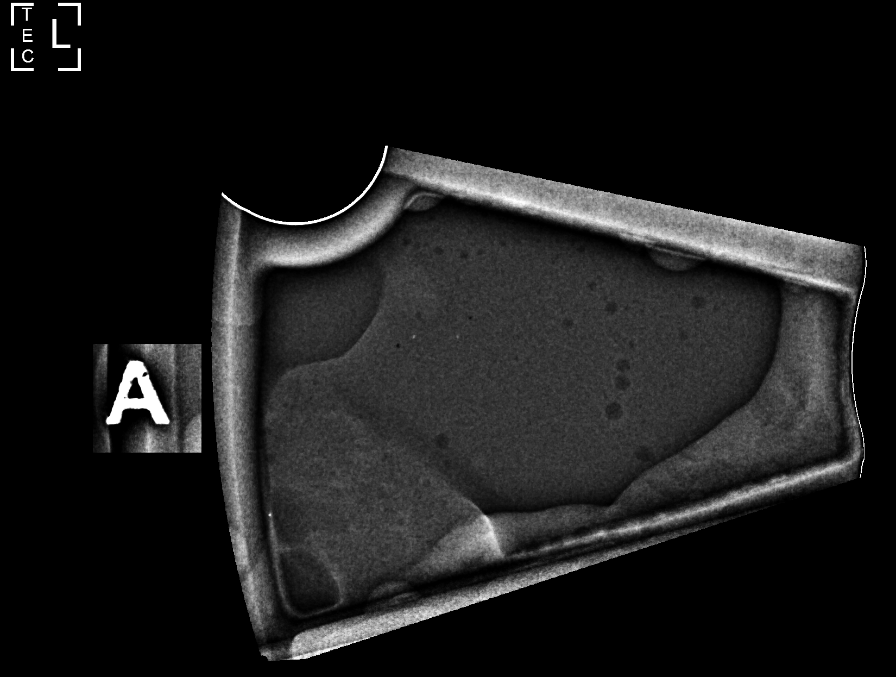

[L (4 of 7)]
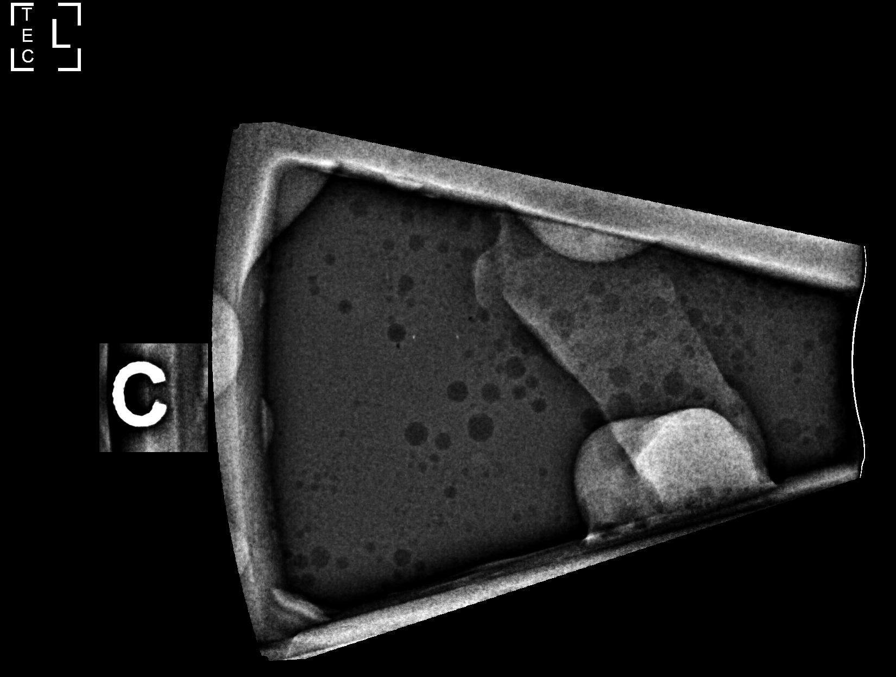

[L (5 of 7)]
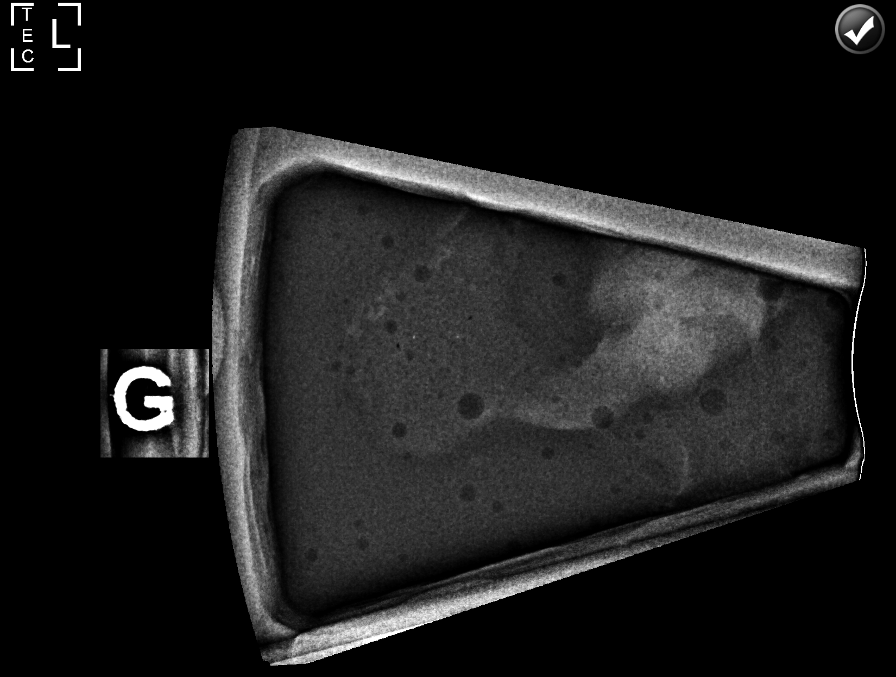

[L (6 of 7)]
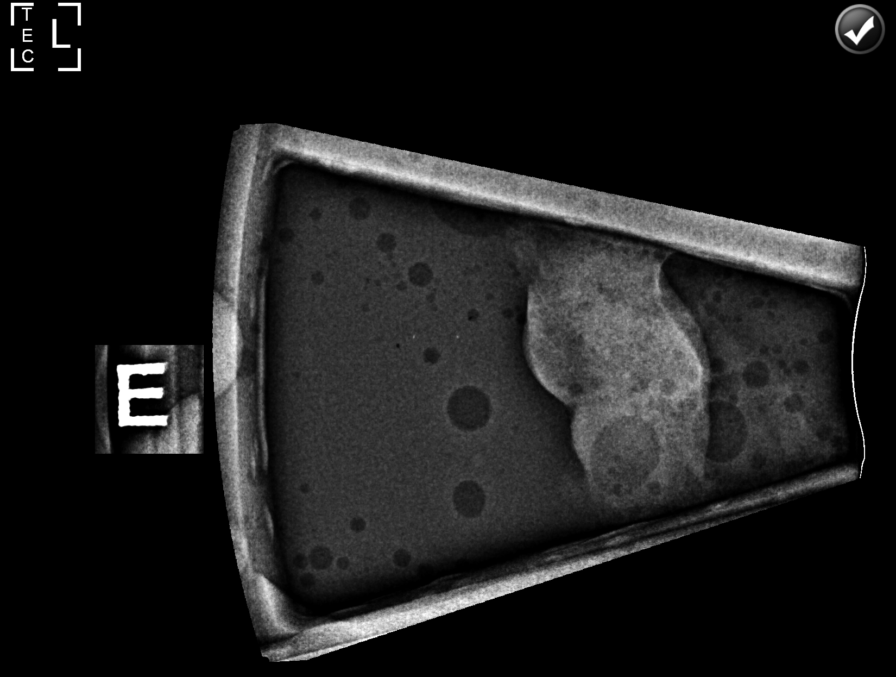

[L (7 of 7)]
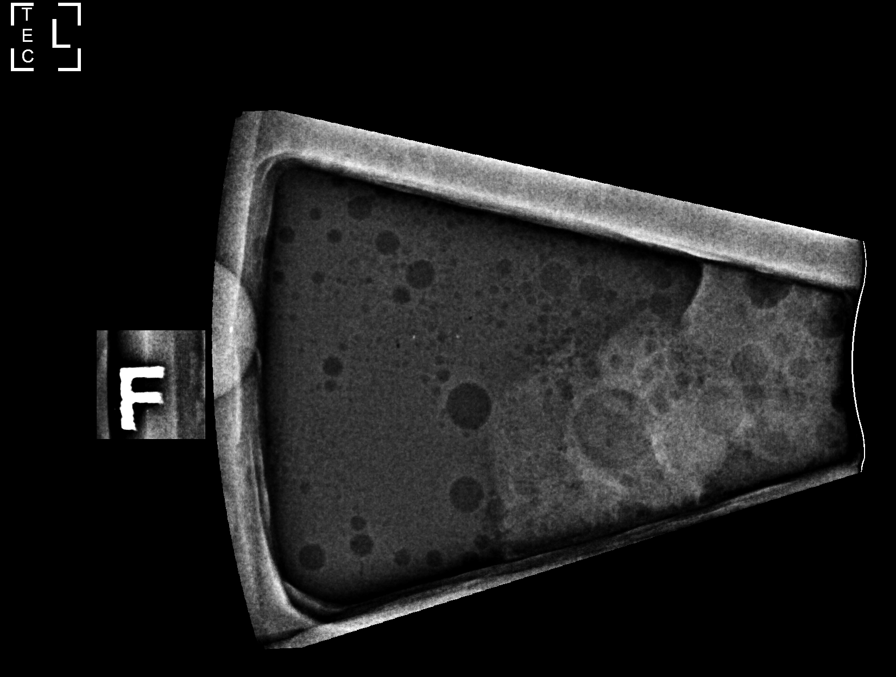

[L ML]
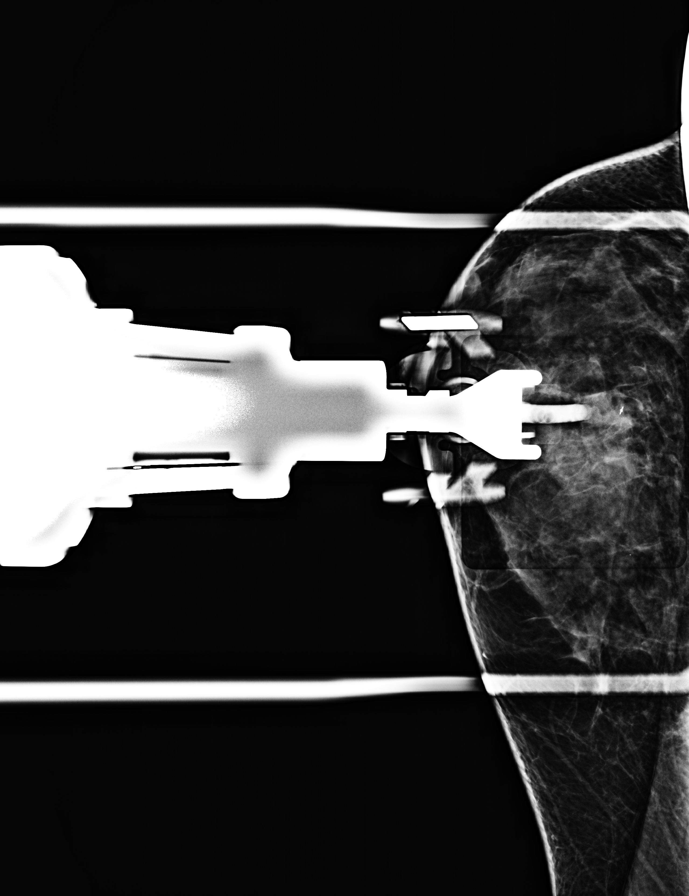

[8 of 20 positions shown; findings below may reference images not displayed]



Using sterile technique and 1% Lidocaine as local anesthetic, under
stereotactic guidance, a 9 gauge vacuum assisted device was used to
perform core needle biopsy of calcifications in the upper inner left
breast using a medial approach. Specimen radiograph was performed
showing calcifications in at least 2 core samples. Specimens with
calcifications are identified for pathology.

Lesion quadrant: Upper inner quadrant

At the conclusion of the procedure, an X shaped shaped tissue marker
clip was deployed into the biopsy cavity. Follow-up 2-view mammogram
was performed and dictated separately.
IMPRESSION: Stereotactic-guided biopsy of calcifications in the upper inner left
breast. No apparent complications.

ADDENDUM:
Pathology revealed ADENOSIS AND FIBROCYSTIC CHANGES WITH APOCRINE
METAPLASIA AND CALCIFICATIONS of the LEFT breast, upper inner, (x
clip). This was found to be concordant by Dr. Ardhani Carrissa.

Pathology results were discussed with the patient by telephone. The
patient reported doing well after the biopsy with tenderness at the
site. Post biopsy instructions and care were reviewed and questions
were answered. The patient was encouraged to call The [REDACTED]

The patient was instructed to return for annual screening
mammography in March 2022.

Pathology results reported by Tajali Niwas, RN on 11/14/2021.



Using sterile technique and 1% Lidocaine as local anesthetic, under
stereotactic guidance, a 9 gauge vacuum assisted device was used to
perform core needle biopsy of calcifications in the upper inner left
breast using a medial approach. Specimen radiograph was performed
showing calcifications in at least 2 core samples. Specimens with
calcifications are identified for pathology.

Lesion quadrant: Upper inner quadrant

At the conclusion of the procedure, an X shaped shaped tissue marker
clip was deployed into the biopsy cavity. Follow-up 2-view mammogram
was performed and dictated separately.
IMPRESSION: Stereotactic-guided biopsy of calcifications in the upper inner left
breast. No apparent complications.

## 2022-01-23 ENCOUNTER — Telehealth: Payer: Managed Care, Other (non HMO) | Admitting: Nurse Practitioner

## 2022-01-23 ENCOUNTER — Telehealth: Payer: Self-pay | Admitting: Family Medicine

## 2022-01-23 NOTE — Telephone Encounter (Signed)
error 

## 2022-06-09 ENCOUNTER — Ambulatory Visit (INDEPENDENT_AMBULATORY_CARE_PROVIDER_SITE_OTHER): Payer: Managed Care, Other (non HMO) | Admitting: Family Medicine

## 2022-06-09 ENCOUNTER — Encounter: Payer: Self-pay | Admitting: Family Medicine

## 2022-06-09 VITALS — BP 113/74 | HR 96 | Temp 98.3°F | Ht 66.0 in | Wt 139.0 lb

## 2022-06-09 DIAGNOSIS — T148XXA Other injury of unspecified body region, initial encounter: Secondary | ICD-10-CM

## 2022-06-09 DIAGNOSIS — S8011XD Contusion of right lower leg, subsequent encounter: Secondary | ICD-10-CM

## 2022-06-09 DIAGNOSIS — M79604 Pain in right leg: Secondary | ICD-10-CM

## 2022-06-09 NOTE — Progress Notes (Signed)
Yolanda Williams , April 07, 1981, 41 y.o., female MRN: [id number] Patient Care Team    Relationship Specialty Notifications Start End  Natalia Leatherwood, DO PCP - General Family Medicine  12/08/17   Sherian Rein, MD Consulting Physician Obstetrics and Gynecology  11/16/17   Chrystie Nose, MD Consulting Physician Cardiology  11/16/17     Chief Complaint  Patient presents with   Leg Pain    Pt c/o R lower leg pain x 2 days; pt had a bruise located near the pain 1.5 weeks ago;      Subjective: Pt presents for an OV with complaints of tender bruise on the right lateral aspect of her ankle.  She became concerned because it had a harder area located in it.  She does not recall injuring the area.  She was playing with her children out in the yard 1-1/2 weeks ago, but does not recall any injury.  Then on Sunday she noticed a sharp pain next to her right shin that went straight up her shin.  She started become more concerned she had a blood clot.     06/09/2022    4:02 PM 09/19/2021    9:18 AM 04/07/2021    8:35 AM 07/29/2020   11:17 AM 09/08/2019   11:41 AM  Depression screen PHQ 2/9  Decreased Interest 0 0 0 0 0  Down, Depressed, Hopeless 0 0 0 0 0  PHQ - 2 Score 0 0 0 0 0  Altered sleeping  0  0   Tired, decreased energy  1  0   Change in appetite  0  0   Feeling bad or failure about yourself   0  0   Trouble concentrating  0  0   Moving slowly or fidgety/restless  0  0   Suicidal thoughts  0  0   PHQ-9 Score  1  0     No Known Allergies Social History   Social History Narrative   Married.    Master's Degree. 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    Takes a daily vitamin.    Smoke alarm in the home. Wears her seatbelt.    Feels safe in her relationships.    Past Medical History:  Diagnosis Date   Anemia    CAP (community acquired pneumonia) 12/06/2018   COVID-19 virus infection 09/2020   Hx of syncope    IUGR (intrauterine growth restriction) 09/10/2015   Shortened  PR interval 2017   seen cardiology, no meds needed.    Past Surgical History:  Procedure Laterality Date   tear duct surgery     Family History  Problem Relation Age of Onset   Hypertension Mother    Miscarriages / [place] Mother    Valvular heart disease Brother        partly fused "cuspid" valves   Diabetes Other        paternal side of family   Allergies as of 06/09/2022   No Known Allergies      Medication List        Accurate as of June 09, 2022  4:09 PM. If you have any questions, ask your nurse or doctor.          multivitamin tablet Take 1 tablet by mouth.   VITAMIN D PO Take by mouth.        All past medical history, surgical history, allergies, family history, immunizations andmedications were updated in the EMR today and  reviewed under the history and medication portions of their EMR.     Review of Systems  Constitutional:  Negative for chills, fever and malaise/fatigue.  Skin:  Negative for rash.   Negative, with the exception of above mentioned in HPI   Objective:  BP 113/74   Pulse 96   Temp 98.3 F (36.8 C) (Oral)   Ht 5\' 6"  (1.676 m)   Wt 139 lb (63 kg)   SpO2 97%   BMI 22.44 kg/m  Body mass index is 22.44 kg/m.  Physical Exam Vitals and nursing note reviewed.  Constitutional:      General: She is not in acute distress.    Appearance: Normal appearance. She is normal weight. She is not ill-appearing or toxic-appearing.  Eyes:     Extraocular Movements: Extraocular movements intact.     Conjunctiva/sclera: Conjunctivae normal.     Pupils: Pupils are equal, round, and reactive to light.  Musculoskeletal:       Legs:     Comments: Green: Quarter size yellow/green bruise present.  No erythema.  No soft tissue swelling or unilateral lower extremity swelling.  No color discoloration/temp difference between lower extremities.  Skin intact.  Neurovascular intact distally.  Good cap refill. Red: Area patient reports tenderness.   Cannot repeat on exam today.  Skin:    Findings: No erythema or rash.     Comments: Yellow/green discolored bruise about the size of a quarter right lateral lower leg near ankle  Neurological:     Mental Status: She is alert and oriented to person, place, and time. Mental status is at baseline.  Psychiatric:        Mood and Affect: Mood normal.        Behavior: Behavior normal.        Thought Content: Thought content normal.        Judgment: Judgment normal.      No results found. No results found. No results found for this or any previous visit (from the past 24 hour(s)).  Assessment/Plan: [name] is a 41 y.o. female present for OV for  Bruise Exam is normal today with the exception of small bruise of right lateral leg near ankle. No concerning features.  Older in nature. Reassured patient this is highly unlikely to be a DVT.  Leg pain, anterior, right The location patient is reporting discomfort is more consistent with mild shinsplints.  She states she has had shinsplints when she was younger and this feels different. Exam today is normal. Reassured patient.  Reviewed expectations re: course of current medical issues. Discussed self-management of symptoms. Outlined signs and symptoms indicating need for more acute intervention. Patient verbalized understanding and all questions were answered. Patient received an After-Visit Summary.    No orders of the defined types were placed in this encounter.  No orders of the defined types were placed in this encounter.  Referral Orders  No referral(s) requested today     Note is dictated utilizing voice recognition software. Although note has been proof read prior to signing, occasional typographical errors still can be missed. If any questions arise, please do not hesitate to call for verification.   electronically signed by:  45, DO  [facility] [place]

## 2022-06-10 ENCOUNTER — Ambulatory Visit: Payer: Managed Care, Other (non HMO) | Admitting: Family Medicine

## 2022-07-29 ENCOUNTER — Other Ambulatory Visit: Payer: Self-pay | Admitting: Obstetrics & Gynecology

## 2022-07-29 DIAGNOSIS — Z1231 Encounter for screening mammogram for malignant neoplasm of breast: Secondary | ICD-10-CM

## 2022-08-04 ENCOUNTER — Encounter: Payer: Self-pay | Admitting: Family Medicine

## 2022-08-04 ENCOUNTER — Telehealth: Payer: Self-pay

## 2022-08-04 ENCOUNTER — Ambulatory Visit (INDEPENDENT_AMBULATORY_CARE_PROVIDER_SITE_OTHER): Payer: Managed Care, Other (non HMO) | Admitting: Family Medicine

## 2022-08-04 VITALS — BP 122/76 | HR 88 | Temp 98.0°F | Ht 66.0 in | Wt 131.0 lb

## 2022-08-04 DIAGNOSIS — S0083XA Contusion of other part of head, initial encounter: Secondary | ICD-10-CM | POA: Diagnosis not present

## 2022-08-04 DIAGNOSIS — M5481 Occipital neuralgia: Secondary | ICD-10-CM

## 2022-08-04 MED ORDER — NAPROXEN 500 MG PO TABS: 500.0000 mg | ORAL_TABLET | Freq: Two times a day (BID) | ORAL | 0 refills | Status: DC

## 2022-08-04 NOTE — Telephone Encounter (Signed)
Patient scheduled appt today for headache. Patient requesting CT or MRI for headache.  I told patient Dr. Claiborne Billings would need to see patient first before ordering CT/MRI. She requested I send message to Dr. Claiborne Billings anyway.  Patient can be reached at [phone]

## 2022-08-04 NOTE — Patient Instructions (Signed)
Occipital Neuralgia  Occipital neuralgia is a type of headache that causes brief episodes of very bad pain in the back of the head. Pain from occipital neuralgia may spread (radiate) to other parts of the head. These headaches may be caused by irritation of the nerves that leave the spinal cord high up in the neck, just below the base of the skull (occipital nerves). The occipital nerves transmit sensations from the back of the head, the top of the head, and the areas behind the ears. What are the causes? This condition can occur without any known cause (primary headache syndrome). In other cases, this condition is caused by pressure on or irritation of one of the two occipital nerves. Pressure and irritation may be due to: Muscle spasm in the neck. Neck injury. Wear and tear of the vertebrae in the neck (osteoarthritis). Disease of the disks that separate the vertebrae. Swollen blood vessels that put pressure on the occipital nerves. Infections. Tumors. Diabetes. What are the signs or symptoms? This condition causes brief burning, stabbing, electric, shocking, or shooting pain in the back of the head that can radiate to the top of the head. It can happen on one side or both sides of the head. It can also cause: Pain behind the eye. Pain triggered by neck movement or hair brushing. Scalp tenderness. Aching in the back of the head between episodes of very bad pain. Pain that gets worse with exposure to bright lights. How is this diagnosed? Your health care provider may diagnose the condition based on a physical exam and your symptoms. Tests may be done, such as: Imaging studies of the brain and neck (cervical spine), such as an MRI or CT scan. These look for causes of pinched nerves. Applying pressure to the nerves in the neck to try to re-create the pain. Injection of numbing medicine into the occipital nerve areas to see if pain goes away (diagnostic nerve block). How is this  treated? Treatment for this condition may begin with simple measures, such as: Rest. Massage. Applying heat or cold to the area. Over-the-counter pain relievers. If these measures do not work, you may need other treatments, including: Medicines, such as: Prescription-strength anti-inflammatory medicines. Muscle relaxants. Anti-seizure medicines, which can relieve pain. Antidepressants, which can relieve pain. Injected medicines, such as medicines that numb the area (local anesthetic) and steroids. Pulsed radiofrequency ablation. This is when wires are implanted to deliver electrical impulses that block pain signals from the occipital nerve. Surgery to relieve nerve pressure. Physical therapy. Follow these instructions at home: Managing pain     Avoid any activities that cause pain. Rest when you have an attack of pain. Try gentle massage to relieve pain. Try a different pillow or sleeping position. If directed, apply heat to the affected area as often as told by your health care provider. Use the heat source that your health care provider recommends, such as a moist heat pack or a heating pad. Place a towel between your skin and the heat source. Leave the heat on for 20-30 minutes. Remove the heat if your skin turns bright red. This is especially important if you are unable to feel pain, heat, or cold. You have a greater risk of getting burned. If directed, put ice on the back of your head and neck area. To do this: Put ice in a plastic bag. Place a towel between your skin and the bag. Leave the ice on for 20 minutes, 2-3 times a day. Remove the ice   if your skin turns bright red. This is very important. If you cannot feel pain, heat, or cold, you have a greater risk of damage to the area. General instructions Take over-the-counter and prescription medicines only as told by your health care provider. Avoid things that make your symptoms worse, such as bright lights. Try to stay  active. Get regular exercise that does not cause pain. Ask your health care provider to suggest safe exercises for you. Work with a physical therapist to learn stretching exercises you can do at home. Practice good posture. Keep all follow-up visits. This is important. Contact a health care provider if: Your medicine is not working. You have new or worsening symptoms. Get help right away if: You have very bad head pain that does not go away. You have a sudden change in vision, balance, or speech. These symptoms may represent a serious problem that is an emergency. Do not wait to see if the symptoms will go away. Get medical help right away. Call your local emergency services (911 in the [place] Do not drive yourself to the hospital. Summary Occipital neuralgia is a type of headache that causes brief episodes of very bad pain in the back of the head. Pain from occipital neuralgia may spread (radiate) to other parts of the head. Treatment for this condition includes rest, massage, and medicines. This information is not intended to replace advice given to you by your health care provider. Make sure you discuss any questions you have with your health care provider. Document Revised: 10/13/2020 Document Reviewed: 10/13/2020 Elsevier Patient Education  2023 Elsevier Inc.  

## 2022-08-04 NOTE — Progress Notes (Unsigned)
Yolanda Williams , [birth year], 41 y.o., female MRN: [id number] Patient Care Team    Relationship Specialty Notifications Start End  Natalia Leatherwood, DO PCP - General Family Medicine  12/08/17   Sherian Rein, MD Consulting Physician Obstetrics and Gynecology  11/16/17   Chrystie Nose, MD Consulting Physician Cardiology  11/16/17     Chief Complaint  Patient presents with   Headache    Pt c/o headache (tenderness near left eyebrow), head tingling x 1 day; pt head bump child Thursday      Subjective: Pt presents for an OV with complaints of discomfort above her left eyebrow.  She reports her child lifted her head quickly and the back of her child's head hit Rachels left thigh/forehead.  This occurred about 5 days ago.  She states she did not have a headache or anything concerning.  She had a small knot in the area with their heads collided, no significant bruising.  She states she does notice her eye appears mildly swollen and she feels there is some pressure that is mild in this location from the swelling.  She states last night she noticed a tingling sensation over her left occipital region.  She states it lasted about 3 to 5 hours and then has slowly improved.  It is not quite yet resolved but it is almost gone.  She denies any visual changes, headaches or tinnitus.     06/09/2022    4:02 PM 09/19/2021    9:18 AM 04/07/2021    8:35 AM 07/29/2020   11:17 AM 09/08/2019   11:41 AM  Depression screen PHQ 2/9  Decreased Interest 0 0 0 0 0  Down, Depressed, Hopeless 0 0 0 0 0  PHQ - 2 Score 0 0 0 0 0  Altered sleeping  0  0   Tired, decreased energy  1  0   Change in appetite  0  0   Feeling bad or failure about yourself   0  0   Trouble concentrating  0  0   Moving slowly or fidgety/restless  0  0   Suicidal thoughts  0  0   PHQ-9 Score  1  0     No Known Allergies Social History   Social History Narrative   Married.    Master's Degree. 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    Takes a daily vitamin.    Smoke alarm in the home. Wears her seatbelt.    Feels safe in her relationships.    Past Medical History:  Diagnosis Date   Anemia    CAP (community acquired pneumonia) 12/06/2018   COVID-19 virus infection 09/2020   Hx of syncope    IUGR (intrauterine growth restriction) 09/10/2015   Shortened PR interval 2017   seen cardiology, no meds needed.    Past Surgical History:  Procedure Laterality Date   tear duct surgery     Family History  Problem Relation Age of Onset   Hypertension Mother    Miscarriages / [place] Mother    Valvular heart disease Brother        partly fused "cuspid" valves   Diabetes Other        paternal side of family   Allergies as of 08/04/2022   No Known Allergies      Medication List        Accurate as of August 04, 2022 11:59 PM. If you have any questions, ask your nurse  or doctor.          multivitamin tablet Take 1 tablet by mouth.   naproxen 500 MG tablet Commonly known as: Naprosyn Take 1 tablet (500 mg total) by mouth 2 (two) times daily with a meal. Started by: Felix Pacini, DO   VITAMIN D PO Take by mouth.        All past medical history, surgical history, allergies, family history, immunizations andmedications were updated in the EMR today and reviewed under the history and medication portions of their EMR.     ROS Negative, with the exception of above mentioned in HPI   Objective:  BP 122/76   Pulse 88   Temp 98 F (36.7 C) (Oral)   Ht 5\' 6"  (1.676 m)   Wt 131 lb (59.4 kg)   LMP 07/14/2022   SpO2 98%   BMI 21.14 kg/m  Body mass index is 21.14 kg/m. Physical Exam Vitals and nursing note reviewed.  Constitutional:      General: She is not in acute distress.    Appearance: Normal appearance. She is normal weight. She is not ill-appearing or toxic-appearing.  HENT:     Head: Normocephalic.     Comments: Left eye with very mild soft tissue swelling below the eyebrow.   No bruising.  No masses.  No tenderness to palpation. Scalp without erythema or rash.  Sensation intact. Eyes:     Extraocular Movements: Extraocular movements intact.     Conjunctiva/sclera: Conjunctivae normal.     Pupils: Pupils are equal, round, and reactive to light.  Musculoskeletal:        General: No tenderness or deformity. Normal range of motion.     Cervical back: No rigidity.     Comments: Mild left paracervical muscle tenderness and ropiness  Neurological:     Mental Status: She is alert and oriented to person, place, and time. Mental status is at baseline.  Psychiatric:        Mood and Affect: Mood normal.        Behavior: Behavior normal.        Thought Content: Thought content normal.        Judgment: Judgment normal.     No results found. No results found. No results found for this or any previous visit (from the past 24 hour(s)).  Assessment/Plan: RAYLINN KOSAR is a 41 y.o. female present for OV for  Occipital neuralgia of left side/2. Facial contusion, initial encounter Patient tolerated exam quite well without tenderness when orbit walls palpated.  Do not suspect fracture at this time.  There has been no changes in vision.  There is very mild swelling below the eyebrow which could resolve with time and anti-inflammatories. The sensation on her scalp is almost resolved without intervention.  Discussed possible occipital neuralgia as etiology secondary to the reaction/impact may have caused irritation to the occipital nerve.  Location of the paresthesia is appropriate for her occipital neuralgia.  Exam of the scalp is normal.  Discussed anti-inflammatory should help resolve the remainder of those symptoms. naproxen 500 mg twice daily x 5-7 days with food Follow-up as needed  Reviewed expectations re: course of current medical issues. Discussed self-management of symptoms. Outlined signs and symptoms indicating need for more acute intervention. Patient  verbalized understanding and all questions were answered. Patient received an After-Visit Summary.    No orders of the defined types were placed in this encounter.  Meds ordered this encounter  Medications   naproxen (NAPROSYN) 500  MG tablet    Sig: Take 1 tablet (500 mg total) by mouth 2 (two) times daily with a meal.    Dispense:  30 tablet    Refill:  0   Referral Orders  No referral(s) requested today     Note is dictated utilizing voice recognition software. Although note has been proof read prior to signing, occasional typographical errors still can be missed. If any questions arise, please do not hesitate to call for verification.   electronically signed by:  Felix Pacini, DO  [facility] [place]

## 2022-08-04 NOTE — Telephone Encounter (Signed)
Pt sched for 340

## 2022-08-10 ENCOUNTER — Encounter: Payer: Self-pay | Admitting: Family Medicine

## 2022-08-10 ENCOUNTER — Ambulatory Visit (INDEPENDENT_AMBULATORY_CARE_PROVIDER_SITE_OTHER): Payer: Managed Care, Other (non HMO) | Admitting: Family Medicine

## 2022-08-10 VITALS — BP 118/67 | HR 85 | Temp 98.2°F | Ht 66.0 in | Wt 131.0 lb

## 2022-08-10 DIAGNOSIS — S0083XA Contusion of other part of head, initial encounter: Secondary | ICD-10-CM

## 2022-08-10 DIAGNOSIS — G44311 Acute post-traumatic headache, intractable: Secondary | ICD-10-CM

## 2022-08-10 DIAGNOSIS — S0990XD Unspecified injury of head, subsequent encounter: Secondary | ICD-10-CM | POA: Diagnosis not present

## 2022-08-10 NOTE — Telephone Encounter (Signed)
Please advise if pt need to keep appt?

## 2022-08-10 NOTE — Progress Notes (Signed)
Yolanda Williams , 1981/06/13, 41 y.o., female MRN: [id number] Patient Care Team    Relationship Specialty Notifications Start End  Natalia Leatherwood, DO PCP - General Family Medicine  12/08/17   Sherian Rein, MD Consulting Physician Obstetrics and Gynecology  11/16/17   Chrystie Nose, MD Consulting Physician Cardiology  11/16/17     Chief Complaint  Patient presents with   Headache    Pt c/o L side headache changes since Saturday      Subjective: Pt presents for an OV with complaints of continued head pain.  She was seen last week and thought she was starting to get better after she sustained an injury to her head.  Her daughters head and hers came in contact.  Patient was hit above the left eye/forehead.  Since this time she has had headaches along the superior left occipital area of her head.  She states over the weekend would either be behind her left eye or on the left side of her head.  She was provided with a steroid taper secondary to possible occipital neuralgia causing symptoms.  Last week her symptoms were described more as a paresthesia of the scalp, now it has transitioned to a headache.  She describes the headache anywhere from a pressure, like throbbing to a diffuse pain.  She denies any current nausea or vomiting and is tolerating p.o.  She denies any hearing or visual changes.  She denies any neurological deficits/changes in her extremities, speech or gait.  She was treated with naproxen twice daily for 5 days.  Prior note: with complaints of discomfort above her left eyebrow.  She reports her child lifted her head quickly and the back of her child's head hit Rachels left thigh/forehead.  This occurred about 5 days ago.  She states she did not have a headache or anything concerning.  She had a small knot in the area with their heads collided, no significant bruising.  She states she does notice her eye appears mildly swollen and she feels there is some pressure  that is mild in this location from the swelling.  She states last night she noticed a tingling sensation over her left occipital region.  She states it lasted about 3 to 5 hours and then has slowly improved.  It is not quite yet resolved but it is almost gone.  She denies any visual changes, headaches or tinnitus.     06/09/2022    4:02 PM 09/19/2021    9:18 AM 04/07/2021    8:35 AM 07/29/2020   11:17 AM 09/08/2019   11:41 AM  Depression screen PHQ 2/9  Decreased Interest 0 0 0 0 0  Down, Depressed, Hopeless 0 0 0 0 0  PHQ - 2 Score 0 0 0 0 0  Altered sleeping  0  0   Tired, decreased energy  1  0   Change in appetite  0  0   Feeling bad or failure about yourself   0  0   Trouble concentrating  0  0   Moving slowly or fidgety/restless  0  0   Suicidal thoughts  0  0   PHQ-9 Score  1  0     No Known Allergies Social History   Social History Narrative   Married.    Master's Degree. 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    Takes a daily vitamin.    Smoke alarm in the home. Wears her seatbelt.  Feels safe in her relationships.    Past Medical History:  Diagnosis Date   Anemia    CAP (community acquired pneumonia) 12/06/2018   COVID-19 virus infection 09/2020   Hx of syncope    IUGR (intrauterine growth restriction) 09/10/2015   Shortened PR interval 2017   seen cardiology, no meds needed.    Past Surgical History:  Procedure Laterality Date   tear duct surgery     Family History  Problem Relation Age of Onset   Hypertension Mother    Miscarriages / [place] Mother    Valvular heart disease Brother        partly fused "cuspid" valves   Diabetes Other        paternal side of family   Allergies as of 08/10/2022   No Known Allergies      Medication List        Accurate as of August 10, 2022  2:34 PM. If you have any questions, ask your nurse or doctor.          STOP taking these medications    naproxen 500 MG tablet Commonly known as: Naprosyn Stopped by:  Felix Pacini, DO       TAKE these medications    multivitamin tablet Take 1 tablet by mouth.   VITAMIN D PO Take by mouth.        All past medical history, surgical history, allergies, family history, immunizations andmedications were updated in the EMR today and reviewed under the history and medication portions of their EMR.     ROS Negative, with the exception of above mentioned in HPI   Objective:  BP 118/67   Pulse 85   Temp 98.2 F (36.8 C) (Oral)   Ht 5\' 6"  (1.676 m)   Wt 131 lb (59.4 kg)   LMP 07/14/2022   SpO2 99%   BMI 21.14 kg/m  Body mass index is 21.14 kg/m. Physical Exam Vitals and nursing note reviewed.  Constitutional:      General: She is not in acute distress.    Appearance: Normal appearance. She is normal weight. She is not ill-appearing or toxic-appearing.  HENT:     Head: Normocephalic.     Comments: Left eye with very mild soft tissue swelling below the eyebrow.  No bruising.  No masses.  No tenderness to palpation. Scalp without erythema or rash.  Sensation intact. Eyes:     General: No visual field deficit.    Extraocular Movements: Extraocular movements intact.     Conjunctiva/sclera: Conjunctivae normal.     Pupils: Pupils are equal, round, and reactive to light.  Musculoskeletal:        General: No tenderness or deformity. Normal range of motion.     Cervical back: No rigidity.     Comments: Mild left paracervical muscle tenderness and ropiness  Neurological:     Mental Status: She is alert and oriented to person, place, and time. Mental status is at baseline.     Cranial Nerves: No cranial nerve deficit, dysarthria or facial asymmetry.     Sensory: No sensory deficit.     Motor: No weakness.     Coordination: Coordination normal.     Gait: Gait normal.  Psychiatric:        Mood and Affect: Mood is anxious.        Speech: Speech normal.        Behavior: Behavior normal.        Thought Content: Thought content normal.  Judgment: Judgment normal.     No results found. No results found. No results found for this or any previous visit (from the past 24 hour(s)).  Assessment/Plan: HAADIYA FROGGE is a 41 y.o. female present for OV for  Facial contusion/headache Swelling over patient's eyes seems to have been improved.  Unfortunately her headache symptoms have worsened despite conservative treatment over the last week.   Headache is now daily and constant.>  CT without head ordered for her to be completed ASAP.  Reviewed expectations re: course of current medical issues. Discussed self-management of symptoms. Outlined signs and symptoms indicating need for more acute intervention. Patient verbalized understanding and all questions were answered. Patient received an After-Visit Summary.    Orders Placed This Encounter  Procedures   CT HEAD WO CONTRAST ( )   No orders of the defined types were placed in this encounter.  Referral Orders  No referral(s) requested today     Note is dictated utilizing voice recognition software. Although note has been proof read prior to signing, occasional typographical errors still can be missed. If any questions arise, please do not hesitate to call for verification.   electronically signed by:  Felix Pacini, DO  [facility] [place]

## 2022-08-10 NOTE — Telephone Encounter (Signed)
Pt will require appt.

## 2022-08-10 NOTE — Addendum Note (Signed)
Addended by: Maxie Barb on: 08/10/2022 04:17 PM   Modules accepted: Orders

## 2022-08-11 ENCOUNTER — Ambulatory Visit: Payer: Managed Care, Other (non HMO)

## 2022-08-11 DIAGNOSIS — S0083XA Contusion of other part of head, initial encounter: Secondary | ICD-10-CM

## 2022-08-11 DIAGNOSIS — G44311 Acute post-traumatic headache, intractable: Secondary | ICD-10-CM

## 2022-08-11 DIAGNOSIS — S0990XD Unspecified injury of head, subsequent encounter: Secondary | ICD-10-CM

## 2022-08-18 ENCOUNTER — Ambulatory Visit: Payer: Managed Care, Other (non HMO)

## 2022-09-03 ENCOUNTER — Ambulatory Visit: Payer: Managed Care, Other (non HMO)

## 2022-09-11 ENCOUNTER — Ambulatory Visit
Admission: RE | Admit: 2022-09-11 | Discharge: 2022-09-11 | Disposition: A | Discharge status: Home / Self Care | Payer: Managed Care, Other (non HMO) | Source: Ambulatory Visit | Attending: Obstetrics & Gynecology | Admitting: Obstetrics & Gynecology

## 2022-09-11 DIAGNOSIS — Z1231 Encounter for screening mammogram for malignant neoplasm of breast: Secondary | ICD-10-CM

## 2022-09-15 ENCOUNTER — Other Ambulatory Visit: Payer: Self-pay | Admitting: Obstetrics & Gynecology

## 2022-09-15 DIAGNOSIS — R928 Other abnormal and inconclusive findings on diagnostic imaging of breast: Secondary | ICD-10-CM

## 2022-09-16 ENCOUNTER — Encounter: Payer: Self-pay | Admitting: Obstetrics & Gynecology

## 2022-09-16 DIAGNOSIS — N6320 Unspecified lump in the left breast, unspecified quadrant: Secondary | ICD-10-CM

## 2022-09-17 ENCOUNTER — Other Ambulatory Visit: Payer: Self-pay | Admitting: Obstetrics & Gynecology

## 2022-09-17 DIAGNOSIS — R928 Other abnormal and inconclusive findings on diagnostic imaging of breast: Secondary | ICD-10-CM

## 2022-09-25 ENCOUNTER — Ambulatory Visit
Admission: RE | Admit: 2022-09-25 | Discharge: 2022-09-25 | Disposition: A | Discharge status: Home / Self Care | Payer: Managed Care, Other (non HMO) | Source: Ambulatory Visit | Attending: Obstetrics & Gynecology | Admitting: Obstetrics & Gynecology

## 2022-09-25 ENCOUNTER — Ambulatory Visit: Payer: Managed Care, Other (non HMO)

## 2022-09-25 DIAGNOSIS — R928 Other abnormal and inconclusive findings on diagnostic imaging of breast: Secondary | ICD-10-CM

## 2022-10-20 ENCOUNTER — Ambulatory Visit: Payer: Managed Care, Other (non HMO) | Admitting: Family Medicine

## 2022-10-20 NOTE — Progress Notes (Deleted)
Yolanda Williams , [birth year], 41 y.o., female MRN: [id number] Patient Care Team    Relationship Specialty Notifications Start End  Yolanda Hillock, DO PCP - General Family Medicine  12/08/17   Yolanda Contes, MD Consulting Physician Obstetrics and Gynecology  11/16/17   Yolanda Casino, MD Consulting Physician Cardiology  11/16/17     No chief complaint on file.    Subjective: Pt presents for an OV with complaints of *** of *** duration.  Associated symptoms include ***.  Pt has tried *** to ease their symptoms.      06/09/2022    4:02 PM 09/19/2021    9:18 AM 04/07/2021    8:35 AM 07/29/2020   11:17 AM 09/08/2019   11:41 AM  Depression screen PHQ 2/9  Decreased Interest 0 0 0 0 0  Down, Depressed, Hopeless 0 0 0 0 0  PHQ - 2 Score 0 0 0 0 0  Altered sleeping  0  0   Tired, decreased energy  1  0   Change in appetite  0  0   Feeling bad or failure about yourself   0  0   Trouble concentrating  0  0   Moving slowly or fidgety/restless  0  0   Suicidal thoughts  0  0   PHQ-9 Score  1  0     No Known Allergies Social History   Social History Narrative   Married.    Master's Degree. Works full time in Pharmacologist.    Takes a daily vitamin.    Smoke alarm in the home. Wears her seatbelt.    Feels safe in her relationships.    Past Medical History:  Diagnosis Date   Anemia    CAP (community acquired pneumonia) 12/06/2018   COVID-19 virus infection 09/2020   Hx of syncope    IUGR (intrauterine growth restriction) 09/10/2015   Shortened PR interval 2017   seen cardiology, no meds needed.    Past Surgical History:  Procedure Laterality Date   tear duct surgery     Family History  Problem Relation Age of Onset   Hypertension Mother    Miscarriages / [place] Mother    Skin cancer Maternal Grandmother    Valvular heart disease Brother        partly fused "cuspid" valves   Diabetes Other        paternal side of family   Allergies as of  10/20/2022   No Known Allergies      Medication List        Accurate as of October 20, 2022  8:00 AM. If you have any questions, ask your nurse or doctor.          multivitamin tablet Take 1 tablet by mouth.   VITAMIN D PO Take by mouth.        All past medical history, surgical history, allergies, family history, immunizations andmedications were updated in the EMR today and reviewed under the history and medication portions of their EMR.     ROS Negative, with the exception of above mentioned in HPI   Objective:  LMP 09/08/2022 (Exact Date)  There is no height or weight on file to calculate BMI.  Physical Exam   No results found. No results found. No results found for this or any previous visit (from the past 24 hour(s)).  Assessment/Plan: Yolanda Williams is a 41 y.o. female present for OV for  *** Reviewed  expectations re: course of current medical issues. Discussed self-management of symptoms. Outlined signs and symptoms indicating need for more acute intervention. Patient verbalized understanding and all questions were answered. Patient received an After-Visit Summary.    No orders of the defined types were placed in this encounter.  No orders of the defined types were placed in this encounter.  Referral Orders  No referral(s) requested today     Note is dictated utilizing voice recognition software. Although note has been proof read prior to signing, occasional typographical errors still can be missed. If any questions arise, please do not hesitate to call for verification.   electronically signed by:  Felix Pacini, DO  [facility] [place]

## 2023-09-20 ENCOUNTER — Ambulatory Visit: Payer: Managed Care, Other (non HMO) | Admitting: Obstetrics & Gynecology

## 2023-10-11 ENCOUNTER — Ambulatory Visit: Payer: Managed Care, Other (non HMO) | Admitting: Obstetrics & Gynecology

## 2023-10-19 ENCOUNTER — Other Ambulatory Visit (HOSPITAL_COMMUNITY)
Admission: RE | Admit: 2023-10-19 | Discharge: 2023-10-19 | Disposition: A | Discharge status: Home / Self Care | Payer: Managed Care, Other (non HMO) | Source: Ambulatory Visit | Attending: Obstetrics & Gynecology | Admitting: Obstetrics & Gynecology

## 2023-10-19 ENCOUNTER — Ambulatory Visit (INDEPENDENT_AMBULATORY_CARE_PROVIDER_SITE_OTHER): Payer: Managed Care, Other (non HMO) | Admitting: Obstetrics & Gynecology

## 2023-10-19 ENCOUNTER — Encounter: Payer: Self-pay | Admitting: Obstetrics & Gynecology

## 2023-10-19 VITALS — BP 118/79 | HR 74 | Ht 65.5 in | Wt 137.0 lb

## 2023-10-19 DIAGNOSIS — Z124 Encounter for screening for malignant neoplasm of cervix: Secondary | ICD-10-CM

## 2023-10-19 DIAGNOSIS — Z01419 Encounter for gynecological examination (general) (routine) without abnormal findings: Secondary | ICD-10-CM

## 2023-10-19 DIAGNOSIS — N644 Mastodynia: Secondary | ICD-10-CM | POA: Diagnosis not present

## 2023-10-19 NOTE — Addendum Note (Signed)
Addended by: Caralyn Guile on: 10/19/2023 09:39 AM   Modules accepted: Orders

## 2023-10-19 NOTE — Progress Notes (Signed)
Subjective:     Yolanda Williams is a 42 y.o. female here for a routine exam.  Patient's last menstrual period was 10/05/2023 (exact date). [id number] Birth Control Method:  rhythm Menstrual Calendar(currently): regular  Current complaints: left breast clip sensitivity.   Current acute medical issues:  none   Recent Gynecologic History Patient's last menstrual period was 10/05/2023 (exact date). Last Pap: 2021,  normal Last mammogr2023am: 2023,  normal  Past Medical History:  Diagnosis Date   Anemia    CAP (community acquired pneumonia) 12/06/2018   COVID-19 virus infection 09/2020   Hx of syncope    IUGR (intrauterine growth restriction) 09/10/2015   Shortened PR interval 2017   seen cardiology, no meds needed.     Past Surgical History:  Procedure Laterality Date   tear duct surgery      OB History     Gravida  2   Para  2   Term  2   Preterm      AB      Living  2      SAB      IAB      Ectopic      Multiple  0   Live Births  2           Social History   Socioeconomic History   Marital status: Married    Spouse name: Not on file   Number of children: 2   Years of education: Not on file   Highest education level: Not on file  Occupational History   Not on file  Tobacco Use   Smoking status: Never   Smokeless tobacco: Never  Vaping Use   Vaping status: Never Used  Substance and Sexual Activity   Alcohol use: No    Comment: social    Drug use: No   Sexual activity: Yes    Partners: Male    Birth control/protection: Rhythm    Comment: Married  Other Topics Concern   Not on file  Social History Narrative   Married.    Master's Degree. 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    Takes a daily vitamin.    Smoke alarm in the home. Wears her seatbelt.    Feels safe in her relationships.    Social Determinants of Health   Financial Resource Strain: Low Risk  (10/19/2023)   Overall Financial Resource Strain (CARDIA)    Difficulty of Paying  Living Expenses: Not hard at all  Food Insecurity: No Food Insecurity (10/19/2023)   Hunger Vital Sign    Worried About Running Out of Food in the Last Year: Never true    Ran Out of Food in the Last Year: Never true  Transportation Needs: Unmet Transportation Needs (10/19/2023)   PRAPARE - Administrator, Civil Service (Medical): Yes    Lack of Transportation (Non-Medical): No  Physical Activity: Insufficiently Active (10/19/2023)   Exercise Vital Sign    Days of Exercise per Week: 3 days    Minutes of Exercise per Session: 30 min  Stress: No Stress Concern Present (10/19/2023)   [employer] of Occupational Health - Occupational Stress Questionnaire    Feeling of Stress : Only a little  Social Connections: Moderately Integrated (10/19/2023)   Social Connection and Isolation Panel [NHANES]    Frequency of Communication with Friends and Family: Twice a week    Frequency of Social Gatherings with Friends and Family: Twice a week    Attends Religious Services: More than  4 times per year    Active Member of Clubs or Organizations: No    Attends Engineer, structural: Patient declined    Marital Status: Married    Family History  Problem Relation Age of Onset   Hypertension Mother    Miscarriages / [place] Mother    Skin cancer Maternal Grandmother    Valvular heart disease Brother        partly fused "cuspid" valves   Diabetes Other        paternal side of family     Current Outpatient Medications:    OVER THE COUNTER MEDICATION, Fish Oil, Disp: , Rfl:    VITAMIN D PO, Take by mouth., Disp: , Rfl:    Multiple Vitamin (MULTIVITAMIN) tablet, Take 1 tablet by mouth. (Patient not taking: Reported on 10/19/2023), Disp: , Rfl:   Review of Systems  Review of Systems  Constitutional: Negative for fever, chills, weight loss, malaise/fatigue and diaphoresis.  HENT: Negative for hearing loss, ear pain, nosebleeds, congestion, sore throat, neck pain,  tinnitus and ear discharge.   Eyes: Negative for blurred vision, double vision, photophobia, pain, discharge and redness.  Respiratory: Negative for cough, hemoptysis, sputum production, shortness of breath, wheezing and stridor.   Cardiovascular: Negative for chest pain, palpitations, orthopnea, claudication, leg swelling and PND.  Gastrointestinal: negative for abdominal pain. Negative for heartburn, nausea, vomiting, diarrhea, constipation, blood in stool and melena.  Genitourinary: Negative for dysuria, urgency, frequency, hematuria and flank pain.  Musculoskeletal: Negative for myalgias, back pain, joint pain and falls.  Skin: Negative for itching and rash.  Neurological: Negative for dizziness, tingling, tremors, sensory change, speech change, focal weakness, seizures, loss of consciousness, weakness and headaches.  Endo/Heme/Allergies: Negative for environmental allergies and polydipsia. Does not bruise/bleed easily.  Psychiatric/Behavioral: Negative for depression, suicidal ideas, hallucinations, memory loss and substance abuse. The patient is not nervous/anxious and does not have insomnia.        Objective:  Blood pressure 118/79, pulse 74, height 5' 5.5" (1.664 m), weight 137 lb (62.1 kg), last menstrual period 10/05/2023.   Physical Exam  Vitals reviewed. Constitutional: She is oriented to person, place, and time. She appears well-developed and well-nourished.  HENT:  Head: Normocephalic and atraumatic.        Right Ear: External ear normal.  Left Ear: External ear normal.  Nose: Nose normal.  Mouth/Throat: Oropharynx is clear and moist.  Eyes: Conjunctivae and EOM are normal. Pupils are equal, round, and reactive to light. Right eye exhibits no discharge. Left eye exhibits no discharge. No scleral icterus.  Neck: Normal range of motion. Neck supple. No tracheal deviation present. No thyromegaly present.  Cardiovascular: Normal rate, regular rhythm, normal heart sounds and  intact distal pulses.  Exam reveals no gallop and no friction rub.   No murmur heard. Respiratory: Effort normal and breath sounds normal. No respiratory distress. She has no wheezes. She has no rales. She exhibits no tenderness.  GI: Soft. Bowel sounds are normal. She exhibits no distension and no mass. There is no tenderness. There is no rebound and no guarding.  Genitourinary:  Breasts no masses skin changes or nipple changes bilaterally      Vulva is normal without lesions Vagina is pink moist without discharge Cervix normal in appearance and pap is done Uterus is normal size shape and contour Adnexa is negative with normal sized ovaries   Musculoskeletal: Normal range of motion. She exhibits no edema and no tenderness.  Neurological: She is alert and  oriented to person, place, and time. She has normal reflexes. She displays normal reflexes. No cranial nerve deficit. She exhibits normal muscle tone. Coordination normal.  Skin: Skin is warm and dry. No rash noted. No erythema. No pallor.  Psychiatric: She has a normal mood and affect. Her behavior is normal. Judgment and thought content normal.       Medications Ordered at today's visit: No orders of the defined types were placed in this encounter.   Other orders placed at today's visit: No orders of the defined types were placed in this encounter.     Assessment:    Normal Gyn exam.   Sensitivity from left breast clip Plan:    Contraception: rhythm method. Follow up in: 3 years.     Return in about 3 years (around 10/18/2026) for yearly.

## 2023-10-21 LAB — CYTOLOGY - PAP
Comment: NEGATIVE
Diagnosis: NEGATIVE
High risk HPV: NEGATIVE

## 2024-05-28 ENCOUNTER — Encounter: Payer: Self-pay | Admitting: Obstetrics & Gynecology

## 2024-05-28 DIAGNOSIS — N6019 Diffuse cystic mastopathy of unspecified breast: Secondary | ICD-10-CM

## 2024-05-28 DIAGNOSIS — Z9189 Other specified personal risk factors, not elsewhere classified: Secondary | ICD-10-CM

## 2024-06-04 ENCOUNTER — Ambulatory Visit (HOSPITAL_COMMUNITY)

## 2024-06-21 DIAGNOSIS — R922 Inconclusive mammogram: Secondary | ICD-10-CM

## 2024-08-23 ENCOUNTER — Encounter: Payer: Self-pay | Admitting: Family Medicine

## 2024-08-23 ENCOUNTER — Ambulatory Visit (INDEPENDENT_AMBULATORY_CARE_PROVIDER_SITE_OTHER): Admitting: Family Medicine

## 2024-08-23 VITALS — BP 102/70 | HR 82 | Temp 98.4°F | Wt 137.4 lb

## 2024-08-23 DIAGNOSIS — R52 Pain, unspecified: Secondary | ICD-10-CM | POA: Diagnosis not present

## 2024-08-23 DIAGNOSIS — L0291 Cutaneous abscess, unspecified: Secondary | ICD-10-CM | POA: Insufficient documentation

## 2024-08-23 MED ORDER — FLUCONAZOLE 150 MG PO TABS: ORAL_TABLET | ORAL | 0 refills | Status: AC

## 2024-08-23 MED ORDER — SULFAMETHOXAZOLE-TRIMETHOPRIM 800-160 MG PO TABS: 1.0000 | ORAL_TABLET | Freq: Two times a day (BID) | ORAL | 0 refills | Status: DC

## 2024-08-23 NOTE — Progress Notes (Signed)
 Williams Williams , Jul 30, 1981, 43 y.o., female MRN: [id number] Patient Care Team    Relationship Specialty Notifications Start End  Williams Williams LABOR, DO PCP - General Family Medicine  12/08/17   Yolanda Vinie BROCKS, MD Consulting Physician Cardiology  11/16/17   Yolanda Vonn DEL, MD Consulting Physician Obstetrics and Gynecology  08/23/24     Chief Complaint  Patient presents with   Mass    Pelvic area      Subjective: Williams Williams is a 43 y.o. Pt presents for an OV with complaints of infected hair follicle pubic area of 5 days duration.  Associated symptoms include red, swollen tender area of her left pubic area. No fever or chills. She has had 3 similar areas over the last few months (buttocks and pubic area). She denies drainage of current location. She did try to pluck a few hairs in the location thinking it was folliculitis. The other abscess prior did drain and self resolve . She has being soaking area in warm bath/epson salt.      10/19/2023    8:41 AM 06/09/2022    4:02 PM 09/19/2021    9:18 AM 04/07/2021    8:35 AM 07/29/2020   11:17 AM  Depression screen PHQ 2/9  Decreased Interest 0 0 0 0 0  Down, Depressed, Hopeless 0 0 0 0 0  PHQ - 2 Score 0 0 0 0 0  Altered sleeping 0  0  0  Tired, decreased energy 1  1  0  Change in appetite 0  0  0  Feeling bad or failure about yourself  0  0  0  Trouble concentrating 0  0  0  Moving slowly or fidgety/restless 0  0  0  Suicidal thoughts 0  0  0  PHQ-9 Score 1  1  0    No Known Allergies Social History   Social History Narrative   Married.    Master's Degree. 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    Takes a daily vitamin.    Smoke alarm in the home. Wears her seatbelt.    Feels safe in her relationships.    Past Medical History:  Diagnosis Date   Anemia    CAP (community acquired pneumonia) 12/06/2018   COVID-19 virus infection 09/2020   Hx of syncope    IUGR (intrauterine growth restriction) 09/10/2015   Shortened  PR interval 2017   seen cardiology, no meds needed.    Past Surgical History:  Procedure Laterality Date   tear duct surgery     Family History  Problem Relation Age of Onset   Hypertension Mother    Miscarriages / [place] Mother    Skin cancer Maternal Grandmother    Valvular heart disease Brother        partly fused cuspid valves   Diabetes Other        paternal side of family   Allergies as of 08/23/2024   No Known Allergies      Medication List        Accurate as of August 23, 2024  8:26 AM. If you have any questions, ask your nurse or doctor.          STOP taking these medications    multivitamin tablet Stopped by: Williams Williams   OVER THE COUNTER MEDICATION Stopped by: Williams Williams       TAKE these medications    fluconazole  150 MG tablet Commonly known as: DIFLUCAN   1 tab po prn Started by: Williams Williams   sulfamethoxazole -trimethoprim  800-160 MG tablet Commonly known as: Bactrim  DS Take 1 tablet by mouth 2 (two) times daily for 7 days. Started by: Williams Williams   VITAMIN D PO Take by mouth.        All past medical history, surgical history, allergies, family history, immunizations andmedications were updated in the EMR today and reviewed under the history and medication portions of their EMR.     ROS Negative, with the exception of above mentioned in HPI   Objective:  BP 102/70   Pulse 82   Temp 98.4 F (36.9 C)   Wt 137 lb 6.4 oz (62.3 kg)   SpO2 100%   BMI 22.52 kg/m  Body mass index is 22.52 kg/m.  Physical Exam Vitals and nursing note reviewed.  Constitutional:      General: She is not in acute distress.    Appearance: Normal appearance. She is normal weight. She is not ill-appearing or toxic-appearing.  HENT:     Head: Normocephalic and atraumatic.  Eyes:     General: No scleral icterus.       Right eye: No discharge.        Left eye: No discharge.     Extraocular Movements: Extraocular movements intact.      Conjunctiva/sclera: Conjunctivae normal.     Pupils: Pupils are equal, round, and reactive to light.  Genitourinary:    General: Normal vulva.     Exam position: Prone.     Labia:        Right: No tenderness.        Left: Tenderness present.       Comments: Swelling and erythema left pubic area extending to left labia. Small area of scab formation, with ~0.5 cm abscess/fluctuance present. TTP. No drainage or bleeding.  Skin:    Findings: No rash.  Neurological:     Mental Status: She is alert and oriented to person, place, and time. Mental status is at baseline.     Motor: No weakness.     Coordination: Coordination normal.     Gait: Gait normal.  Psychiatric:        Mood and Affect: Mood normal.        Behavior: Behavior normal.        Thought Content: Thought content normal.        Judgment: Judgment normal.      No results found. No results found. No results found for this or any previous visit (from the past 24 hours).  Assessment/Plan: Williams Williams is a 43 y.o. female present for OV for  Abscess (Primary)/ pubic pain - Hibiclens BID - avoid shaving for now. Replace razor. -Bactrim  DS twice daily x 5 days NSAIDS/tylenol  for discomfort - f/u 1 week - if needed would I&D at that time, being a smaller abscess abx alone will likely treat.  Reviewed expectations re: course of current medical issues. Discussed self-management of symptoms. Outlined signs and symptoms indicating need for more acute intervention. Patient verbalized understanding and all questions were answered. Patient received an After-Visit Summary.    No orders of the defined types were placed in this encounter.  Meds ordered this encounter  Medications   sulfamethoxazole -trimethoprim  (BACTRIM  DS) 800-160 MG tablet    Sig: Take 1 tablet by mouth 2 (two) times daily for 7 days.    Dispense:  14 tablet    Refill:  0   fluconazole  (DIFLUCAN ) 150 MG tablet  Sig: 1 tab po prn    Dispense:  2  tablet    Refill:  0   Referral Orders  No referral(s) requested today     Note is dictated utilizing voice recognition software. Although note has been proof read prior to signing, occasional typographical errors still can be missed. If any questions arise, please do not hesitate to call for verification.   electronically signed by:  Williams Bellini, DO  [facility] - [place]

## 2024-08-23 NOTE — Patient Instructions (Addendum)
 Return in about 1 week (around 08/30/2024) for groin abscess.  Hibiclens washes daily - over the counter Epson salt/warm compresses and soaks 1-2 x daily  Called in antibiotic 7 day course (every 12 hours) Diflucan  also called in if needed.      Great to see you today.  I have refilled the medication(s) we provide.   If labs were collected or images ordered, we will inform you of  results once we have received them and reviewed. We will contact you either by echart message, or telephone call.  Please give ample time to the testing facility, and our office to run,  receive and review results. Please do not call inquiring of results, even if you can see them in your chart. We will contact you as soon as we are able. If it has been over 1 week since the test was completed, and you have not yet heard from us , then please call us .    - echart message- for normal results that have been seen by the patient already.   - telephone call: abnormal results or if patient has not viewed results in their echart.  If a referral to a specialist was entered for you, please call us  in 2 weeks if you have not heard from the specialist office to schedule.

## 2024-08-29 ENCOUNTER — Ambulatory Visit: Admitting: Family Medicine

## 2024-08-30 ENCOUNTER — Ambulatory Visit (INDEPENDENT_AMBULATORY_CARE_PROVIDER_SITE_OTHER): Admitting: Family Medicine

## 2024-08-30 VITALS — BP 118/80 | HR 76 | Temp 98.3°F | Wt 137.0 lb

## 2024-08-30 DIAGNOSIS — L0291 Cutaneous abscess, unspecified: Secondary | ICD-10-CM

## 2024-08-30 MED ORDER — SULFAMETHOXAZOLE-TRIMETHOPRIM 800-160 MG PO TABS: 1.0000 | ORAL_TABLET | Freq: Two times a day (BID) | ORAL | 0 refills | Status: AC

## 2024-08-30 NOTE — Patient Instructions (Signed)

## 2024-08-30 NOTE — Progress Notes (Signed)
 Yolanda Williams , [birth year], 43 y.o., female MRN: [id number] Patient Care Team    Relationship Specialty Notifications Start End  Catherine Charlies LABOR, DO PCP - General Family Medicine  12/08/17   Mona Vinie BROCKS, MD Consulting Physician Cardiology  11/16/17   Jayne Vonn DEL, MD Consulting Physician Obstetrics and Gynecology  08/23/24     Chief Complaint  Patient presents with   Abscess    Still c/o irritation/tenderness.      Subjective: Yolanda Williams is a 42 y.o. Pt presents for an OV to follow-up on left groin abscess.  Patient finished her Bactrim  double strength antibiotic today.  She reports the swelling surrounding had completely resolved, she still has noticed a small amount of swelling remains.  No fevers.  No drainage.  Prior note: with complaints of infected hair follicle pubic area of 5 days duration.  Associated symptoms include red, swollen tender area of her left pubic area. No fever or chills. She has had 3 similar areas over the last few months (buttocks and pubic area). She denies drainage of current location. She did try to pluck a few hairs in the location thinking it was folliculitis. The other abscess prior did drain and self resolve . She has being soaking area in warm bath/epson salt.      10/19/2023    8:41 AM 06/09/2022    4:02 PM 09/19/2021    9:18 AM 04/07/2021    8:35 AM 07/29/2020   11:17 AM  Depression screen PHQ 2/9  Decreased Interest 0 0 0 0 0  Down, Depressed, Hopeless 0 0 0 0 0  PHQ - 2 Score 0 0 0 0 0  Altered sleeping 0  0  0  Tired, decreased energy 1  1  0  Change in appetite 0  0  0  Feeling bad or failure about yourself  0  0  0  Trouble concentrating 0  0  0  Moving slowly or fidgety/restless 0  0  0  Suicidal thoughts 0  0  0  PHQ-9 Score 1  1  0    No Known Allergies Social History   Social History Narrative   Married.    Master's Degree. 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    Takes a daily vitamin.    Smoke alarm in the  home. Wears her seatbelt.    Feels safe in her relationships.    Past Medical History:  Diagnosis Date   Anemia    CAP (community acquired pneumonia) 12/06/2018   COVID-19 virus infection 09/2020   Hx of syncope    IUGR (intrauterine growth restriction) 09/10/2015   Shortened PR interval 2017   seen cardiology, no meds needed.    Past Surgical History:  Procedure Laterality Date   tear duct surgery     Family History  Problem Relation Age of Onset   Hypertension Mother    Miscarriages / [place] Mother    Skin cancer Maternal Grandmother    Valvular heart disease Brother        partly fused cuspid valves   Diabetes Other        paternal side of family   Allergies as of 08/30/2024   No Known Allergies      Medication List        Accurate as of August 30, 2024 11:50 AM. If you have any questions, ask your nurse or doctor.  fluconazole  150 MG tablet Commonly known as: DIFLUCAN  1 tab po prn   sulfamethoxazole -trimethoprim  800-160 MG tablet Commonly known as: Bactrim  DS Take 1 tablet by mouth 2 (two) times daily for 5 days.   VITAMIN D PO Take by mouth.        All past medical history, surgical history, allergies, family history, immunizations andmedications were updated in the EMR today and reviewed under the history and medication portions of their EMR.     ROS Negative, with the exception of above mentioned in HPI   Objective:  BP 118/80   Pulse 76   Temp 98.3 F (36.8 C)   Wt 137 lb (62.1 kg)   SpO2 98%   BMI 22.45 kg/m  Body mass index is 22.45 kg/m.  Physical Exam Vitals and nursing note reviewed.  Constitutional:      General: She is not in acute distress.    Appearance: Normal appearance. She is normal weight. She is not ill-appearing or toxic-appearing.  HENT:     Head: Normocephalic and atraumatic.  Eyes:     General: No scleral icterus.       Right eye: No discharge.        Left eye: No discharge.     Extraocular  Movements: Extraocular movements intact.     Conjunctiva/sclera: Conjunctivae normal.     Pupils: Pupils are equal, round, and reactive to light.  Genitourinary:    Comments: GU: No erythema, no fluctuance.  Small amount of swelling remains your left groin.  No drainage. Skin:    Findings: No rash.  Neurological:     Mental Status: She is alert and oriented to person, place, and time. Mental status is at baseline.     Motor: No weakness.     Coordination: Coordination normal.     Gait: Gait normal.  Psychiatric:        Mood and Affect: Mood normal.        Behavior: Behavior normal.        Thought Content: Thought content normal.        Judgment: Judgment normal.      No results found. No results found. No results found for this or any previous visit (from the past 24 hours).  Assessment/Plan: Yolanda Williams is a 43 y.o. female present for OV for  Abscess (Primary)/ pubic pain - Continue Hibiclens BID - avoid shaving for now until symptoms are completely resolved.  Once returning to shaving make sure razor has been replaced. -Bactrim  DS twice daily x 5 days extended to be safe. NSAIDS/tylenol  for discomfort Abscess appears to have resolved, still small amount of swelling. No follow-up needed unless symptoms recur.  Reviewed expectations re: course of current medical issues. Discussed self-management of symptoms. Outlined signs and symptoms indicating need for more acute intervention. Patient verbalized understanding and all questions were answered. Patient received an After-Visit Summary.    No orders of the defined types were placed in this encounter.  Meds ordered this encounter  Medications   sulfamethoxazole -trimethoprim  (BACTRIM  DS) 800-160 MG tablet    Sig: Take 1 tablet by mouth 2 (two) times daily for 5 days.    Dispense:  10 tablet    Refill:  0   Referral Orders  No referral(s) requested today     Note is dictated utilizing voice recognition  software. Although note has been proof read prior to signing, occasional typographical errors still can be missed. If any questions arise, please do not hesitate to  call for verification.   electronically signed by:  Charlies Bellini, DO  [facility] - [place]

## 2024-10-12 ENCOUNTER — Ambulatory Visit: Payer: Self-pay

## 2024-10-12 NOTE — Telephone Encounter (Signed)
 FYI Only or Action Required?: Action required by provider: clinical question for provider.  Patient was last seen in primary care on 08/30/2024 by Catherine Fuller A, DO.  Called Nurse Triage reporting Advice Only.  Symptoms began N/A.  Interventions attempted: Other: N/A.  Symptoms are: N/A.  Triage Disposition: Call PCP When Office is Open  Patient/caregiver understands and will follow disposition?: Yes  Copied from CRM [id number]. Topic: Clinical - Medical Advice >> Oct 12, 2024 11:41 AM Alfonso ORN wrote: Reason for CRM: contact with someone with [facility] and what to do next. please call pt back  For guidance [phone] (M) Reason for Disposition  [1] Caller requesting NON-URGENT health information AND [2] PCP's office is the best resource  Answer Assessment - Initial Assessment Questions Pt states that her husband is positive for MRSA and has extensive medical hx. Would like to ask provider about what to do proactively for self to ensure infection does not spread person to person. Asking about a possible proactively taking an abx.   1. REASON FOR CALL: What is the main reason for your call? or How can I best help you?     Would like to know if there's anything she can do proactively to ensure she does not contract MRSA 2. SYMPTOMS : Do you have any symptoms?      Denies. 3. OTHER QUESTIONS: Do you have any other questions?     denies  Protocols used: Information Only Call - No Triage-A-AH

## 2024-10-12 NOTE — Telephone Encounter (Signed)
 Pt was in contact with someone with [facility] and wants to know what should she do? Please advise if pt needs to schedule an appt.

## 2024-10-13 ENCOUNTER — Encounter: Payer: Self-pay | Admitting: Family Medicine

## 2025-01-19 ENCOUNTER — Ambulatory Visit (INDEPENDENT_AMBULATORY_CARE_PROVIDER_SITE_OTHER): Admitting: Family Medicine

## 2025-01-19 ENCOUNTER — Encounter: Payer: Self-pay | Admitting: Family Medicine

## 2025-01-19 VITALS — BP 104/76 | HR 82 | Temp 98.1°F | Wt 143.4 lb

## 2025-01-19 DIAGNOSIS — L03012 Cellulitis of left finger: Secondary | ICD-10-CM

## 2025-01-19 MED ORDER — DOXYCYCLINE HYCLATE 100 MG PO CAPS: 100.0000 mg | ORAL_CAPSULE | Freq: Two times a day (BID) | ORAL | 0 refills | Status: AC

## 2025-01-19 MED ORDER — MUPIROCIN 2 % EX OINT: 1.0000 | TOPICAL_OINTMENT | Freq: Two times a day (BID) | CUTANEOUS | 0 refills | Status: AC

## 2025-01-19 NOTE — Progress Notes (Signed)
 "      Yolanda Williams , [birth year], 44 y.o., female MRN: [id number] Patient Care Team    Relationship Specialty Notifications Start End  Catherine Charlies LABOR, DO PCP - General Family Medicine  12/08/17   Mona Vinie BROCKS, MD Consulting Physician Cardiology  11/16/17   Jayne Vonn DEL, MD Consulting Physician Obstetrics and Gynecology  08/23/24     Chief Complaint  Patient presents with   Nail Problem    Since Monday. L index finger. Redness, swelling, throbbing pain.      Subjective: Yolanda Williams is a 44 y.o. Pt presents for an OV with complaints of red, swollen and tender left index finger of 4-5 days duration.  Associated symptoms include swelling. Patient denies fevers, chills or drainage.  Patient reports no recent injury.     01/19/2025    1:39 PM 10/19/2023    8:41 AM 06/09/2022    4:02 PM 09/19/2021    9:18 AM 04/07/2021    8:35 AM  Depression screen PHQ 2/9  Decreased Interest 0 0 0 0 0  Down, Depressed, Hopeless 0 0 0 0 0  PHQ - 2 Score 0 0 0 0 0  Altered sleeping 0 0  0   Tired, decreased energy 0 1  1   Change in appetite 0 0  0   Feeling bad or failure about yourself  0 0  0   Trouble concentrating 0 0  0   Moving slowly or fidgety/restless 0 0  0   Suicidal thoughts 0 0  0   PHQ-9 Score 0 1   1    Difficult doing work/chores Not difficult at all         Data saved with a previous flowsheet row definition      10/18/2020    6:13 PM 03/21/2021   11:27 AM 09/19/2021    9:17 AM 10/19/2023    8:39 AM 01/19/2025    1:39 PM  Fall Risk  Falls in the past year?  0 0 0 0  Was there an injury with Fall?  0   0  0  Fall Risk Category Calculator  0  0 0  Fall Risk Category (Retired)  Low      (RETIRED) Patient Fall Risk Level Low fall risk  Low fall risk  Low fall risk     Patient at Risk for Falls Due to     No Fall Risks  Fall risk Follow up  Falls evaluation completed    Falls evaluation completed     Data saved with a previous flowsheet row definition     Allergies[1] Social History   Social History Narrative   Married.    Master's Degree. 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    Takes a daily vitamin.    Smoke alarm in the home. Wears her seatbelt.    Feels safe in her relationships.    Past Medical History:  Diagnosis Date   Anemia    CAP (community acquired pneumonia) 12/06/2018   COVID-19 virus infection 09/2020   Hx of syncope    IUGR (intrauterine growth restriction) 09/10/2015   Shortened PR interval 2017   seen cardiology, no meds needed.    Past Surgical History:  Procedure Laterality Date   tear duct surgery     Family History  Problem Relation Age of Onset   Hypertension Mother    Miscarriages / Stillbirths Mother    Skin cancer Maternal Grandmother    Valvular  heart disease Brother        partly fused cuspid valves   Diabetes Other        paternal side of family   Allergies as of 01/19/2025   No Known Allergies      Medication List        Accurate as of January 19, 2025  1:40 PM. If you have any questions, ask your nurse or doctor.          fluconazole  150 MG tablet Commonly known as: DIFLUCAN  1 tab po prn   VITAMIN D PO Take by mouth.        All past medical history, surgical history, allergies, family history, immunizations andmedications were updated in the EMR today and reviewed under the history and medication portions of their EMR.     ROS Negative, with the exception of above mentioned in HPI   Objective:  BP 104/76   Pulse 82   Temp 98.1 F (36.7 C)   Wt 143 lb 6.4 oz (65 kg)   SpO2 99%   BMI 23.50 kg/m  Body mass index is 23.5 kg/m. Physical Exam Vitals and nursing note reviewed.  Constitutional:      General: She is not in acute distress.    Appearance: Normal appearance. She is normal weight. She is not ill-appearing or toxic-appearing.  HENT:     Head: Normocephalic and atraumatic.  Eyes:     General: No scleral icterus.       Right eye: No discharge.         Left eye: No discharge.     Extraocular Movements: Extraocular movements intact.     Conjunctiva/sclera: Conjunctivae normal.     Pupils: Pupils are equal, round, and reactive to light.  Musculoskeletal:        General: Swelling and tenderness present.     Comments: Left index finger: redness, TTP and swelling DIP joint and medial aspect of nail fold. No obvious abscess or fluctuance.   Skin:    Findings: No rash.  Neurological:     Mental Status: She is alert and oriented to person, place, and time. Mental status is at baseline.     Motor: No weakness.     Coordination: Coordination normal.     Gait: Gait normal.  Psychiatric:        Mood and Affect: Mood normal.        Behavior: Behavior normal.        Thought Content: Thought content normal.        Judgment: Judgment normal.    No results found. No results found. No results found for this or any previous visit (from the past 24 hours).  Assessment/Plan: Yolanda Williams is a 44 y.o. female present for OV for  There are no diagnoses linked to this encounter. Paronychia of finger of left hand (Primary) -Epsom salt soak/warm water 15 minutes daily -Bactroban  ointment/cream twice daily until completely resolved -Doxycycline  twice daily x 7 days Follow-up in 1 week if symptoms are not resolving, sooner if worsening  Reviewed expectations re: course of current medical issues. Discussed self-management of symptoms. Outlined signs and symptoms indicating need for more acute intervention. Patient verbalized understanding and all questions were answered. Patient received an After-Visit Summary.    No orders of the defined types were placed in this encounter.  No orders of the defined types were placed in this encounter.  Referral Orders  No referral(s) requested today     Note is  dictated utilizing voice recognition software. Although note has been proof read prior to signing, occasional typographical errors still can be  missed. If any questions arise, please do not hesitate to call for verification.   electronically signed by:  Charlies Bellini, DO  [facility] [place]       [1] No Known Allergies  "
